# Patient Record
Sex: Female | Born: 1985 | Hispanic: Yes | Marital: Married | Smoking: Former smoker
Health system: Southern US, Community
[De-identification: ages and names within clinical notes are randomized; demographics above are authoritative.]

## PROBLEM LIST (undated history)

## (undated) DIAGNOSIS — G43909 Migraine, unspecified, not intractable, without status migrainosus: Secondary | ICD-10-CM

## (undated) DIAGNOSIS — R519 Headache, unspecified: Secondary | ICD-10-CM

## (undated) DIAGNOSIS — E039 Hypothyroidism, unspecified: Secondary | ICD-10-CM

## (undated) DIAGNOSIS — Z87440 Personal history of urinary (tract) infections: Secondary | ICD-10-CM

## (undated) DIAGNOSIS — E785 Hyperlipidemia, unspecified: Secondary | ICD-10-CM

## (undated) DIAGNOSIS — R51 Headache: Secondary | ICD-10-CM

## (undated) DIAGNOSIS — I10 Essential (primary) hypertension: Secondary | ICD-10-CM

## (undated) HISTORY — DX: Hyperlipidemia, unspecified: E78.5

## (undated) HISTORY — PX: HAND SURGERY: SHX662

## (undated) HISTORY — DX: Hypothyroidism, unspecified: E03.9

## (undated) HISTORY — DX: Essential (primary) hypertension: I10

## (undated) HISTORY — DX: Headache, unspecified: R51.9

## (undated) HISTORY — DX: Personal history of urinary (tract) infections: Z87.440

## (undated) HISTORY — PX: WISDOM TOOTH EXTRACTION: SHX21

## (undated) HISTORY — DX: Headache: R51

## (undated) HISTORY — DX: Migraine, unspecified, not intractable, without status migrainosus: G43.909

---
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
## 2003-08-21 ENCOUNTER — Emergency Department (HOSPITAL_COMMUNITY): Admission: EM | Admit: 2003-08-21 | Discharge: 2003-08-22 | Payer: Self-pay | Admitting: Emergency Medicine

## 2004-09-27 ENCOUNTER — Emergency Department (HOSPITAL_COMMUNITY): Admission: EM | Admit: 2004-09-27 | Discharge: 2004-09-27 | Payer: Self-pay | Admitting: Emergency Medicine

## 2005-05-09 ENCOUNTER — Emergency Department (HOSPITAL_COMMUNITY): Admission: EM | Admit: 2005-05-09 | Discharge: 2005-05-09 | Payer: Self-pay | Admitting: Emergency Medicine

## 2005-05-11 ENCOUNTER — Ambulatory Visit (HOSPITAL_COMMUNITY): Admission: RE | Admit: 2005-05-11 | Discharge: 2005-05-11 | Payer: Self-pay | Admitting: Obstetrics and Gynecology

## 2005-06-21 ENCOUNTER — Ambulatory Visit (HOSPITAL_COMMUNITY): Admission: AD | Admit: 2005-06-21 | Discharge: 2005-06-21 | Payer: Self-pay | Admitting: Obstetrics and Gynecology

## 2005-08-05 ENCOUNTER — Ambulatory Visit (HOSPITAL_COMMUNITY): Admission: AD | Admit: 2005-08-05 | Discharge: 2005-08-05 | Payer: Self-pay | Admitting: Obstetrics and Gynecology

## 2005-08-26 ENCOUNTER — Ambulatory Visit (HOSPITAL_COMMUNITY): Admission: AD | Admit: 2005-08-26 | Discharge: 2005-08-26 | Payer: Self-pay | Admitting: Obstetrics and Gynecology

## 2005-09-04 ENCOUNTER — Inpatient Hospital Stay (HOSPITAL_COMMUNITY): Admission: AD | Admit: 2005-09-04 | Discharge: 2005-09-06 | Payer: Self-pay | Admitting: Obstetrics & Gynecology

## 2005-09-16 ENCOUNTER — Ambulatory Visit (HOSPITAL_COMMUNITY): Admission: RE | Admit: 2005-09-16 | Discharge: 2005-09-16 | Payer: Self-pay | Admitting: Obstetrics & Gynecology

## 2005-09-26 ENCOUNTER — Emergency Department (HOSPITAL_COMMUNITY): Admission: EM | Admit: 2005-09-26 | Discharge: 2005-09-26 | Payer: Self-pay | Admitting: Emergency Medicine

## 2006-02-10 ENCOUNTER — Emergency Department (HOSPITAL_COMMUNITY): Admission: EM | Admit: 2006-02-10 | Discharge: 2006-02-10 | Payer: Self-pay | Admitting: Emergency Medicine

## 2006-04-23 ENCOUNTER — Ambulatory Visit (HOSPITAL_COMMUNITY): Admission: AD | Admit: 2006-04-23 | Discharge: 2006-04-23 | Payer: Self-pay | Admitting: Obstetrics and Gynecology

## 2006-08-22 ENCOUNTER — Ambulatory Visit (HOSPITAL_COMMUNITY): Admission: RE | Admit: 2006-08-22 | Discharge: 2006-08-22 | Payer: Self-pay | Admitting: Obstetrics and Gynecology

## 2006-08-26 ENCOUNTER — Ambulatory Visit (HOSPITAL_COMMUNITY): Admission: AD | Admit: 2006-08-26 | Discharge: 2006-08-26 | Payer: Self-pay | Admitting: Obstetrics and Gynecology

## 2006-09-01 ENCOUNTER — Inpatient Hospital Stay (HOSPITAL_COMMUNITY): Admission: AD | Admit: 2006-09-01 | Discharge: 2006-09-04 | Payer: Self-pay | Admitting: Obstetrics and Gynecology

## 2007-07-04 ENCOUNTER — Other Ambulatory Visit: Admission: RE | Admit: 2007-07-04 | Discharge: 2007-07-04 | Payer: Self-pay | Admitting: Obstetrics and Gynecology

## 2009-02-24 ENCOUNTER — Emergency Department (HOSPITAL_COMMUNITY): Admission: EM | Admit: 2009-02-24 | Discharge: 2009-02-24 | Payer: Self-pay | Admitting: Emergency Medicine

## 2009-06-29 ENCOUNTER — Ambulatory Visit (HOSPITAL_COMMUNITY): Admission: RE | Admit: 2009-06-29 | Discharge: 2009-06-29 | Payer: Self-pay | Admitting: Family Medicine

## 2009-08-17 ENCOUNTER — Emergency Department (HOSPITAL_COMMUNITY): Admission: EM | Admit: 2009-08-17 | Discharge: 2009-08-17 | Payer: Self-pay | Admitting: Emergency Medicine

## 2009-10-15 ENCOUNTER — Ambulatory Visit (HOSPITAL_COMMUNITY): Admission: RE | Admit: 2009-10-15 | Discharge: 2009-10-15 | Payer: Self-pay | Admitting: Family Medicine

## 2009-12-25 ENCOUNTER — Emergency Department (HOSPITAL_COMMUNITY): Admission: EM | Admit: 2009-12-25 | Discharge: 2009-12-25 | Payer: Self-pay | Admitting: Emergency Medicine

## 2010-08-10 NOTE — H&P (Signed)
NAME:  Sandra Boone, Sandra Boone              ACCOUNT NO.:  [id number]   MEDICAL RECORD NO.:  [id number]          PATIENT TYPE:  INP   LOCATION:  LDR                           FACILITY:  [facility]   PHYSICIAN:  Tilda Burrow, M.D. DATE OF BIRTH:  June 26, 1985   DATE OF ADMISSION:  DATE OF DISCHARGE:  LH                              HISTORY & PHYSICAL   DATE OF PLANNED ADMISSION:  09/01/2006.   CHIEF COMPLAINT:  Pregnancy at 39 weeks, elective induction.  History of  large for gestational age infant.   HISTORY OF PRESENT ILLNESS:  This 25 year old female, gravida 3, para 2,  AB 0, now at [redacted] weeks gestation, is admitted after being followed  through our office through 16 prenatal visits.  The patient is miserable  and requests induction.  She has been induced at 38 weeks in the past  with 6-1/2 hour labors resulting from it.  She has had babies that weigh  8 and 9 pounds to date.  This one's estimated fetal weight is the same  range.  Ultrasound was recently performed and is not available at this  moment, but done at [facility].  She has been in to Labor and  Delivery twice in the last week, complaining of pressure and labor  symptoms, but has not changed her cervix.  Her cervix is currently 2 cm,  60%, minus 3 with presenting part applied to the cervix but remaining  quite high.  This is almost identical to her last pregnancy, prodromal  symptoms.   Prenatal course has been notably for labs type A positive, UDS negative,  rubella immune.  Present hemoglobin 13, hematocrit 39 dropping to 12 and  37.2 at 28 weeks.  Hepatitis, HIV, RPR, GC, chlamydia are all negative.  Group B Strep is positive.  Glucose tolerance test 103 mg percent at 28  weeks.  She has had no glucosuria.   She has a history of abnormal Pap smears in the past, none currently of  concern.  She has a history of migraines, not currently being treated.   PAST SURGICAL HISTORY:  Right hand surgery.   ALLERGIES:   ASPIRIN.   SOCIAL HISTORY:  She is married, a homemaker.  She plans to have  epidural.  This is a female child and desires circumcision and plans for  oral contraception use post partum.   PHYSICAL EXAMINATION:  Height 5'4, weight 282 which is a 27 pound  weight gain.  Blood pressure 124/66.  Urinalysis negative at last check.  Fundal height 41 cm.  Cervix 2, 50%, minus 3, vertex and high.   PLAN:  Follow the above.  Cervical ripening Friday night with pitocin  induction and probable epidural on Saturday, 09/02/2006.      Tilda Burrow, M.D.     JVF/MEDQ  D:  08/29/2006  T:  08/29/2006  Job:  [id number]   cc:   Jeoffrey Massed, MD  Fax: [phone]

## 2010-08-10 NOTE — Op Note (Signed)
NAME:  Sandra Boone, Sandra Boone              ACCOUNT NO.:  [id number]   MEDICAL RECORD NO.:  [id number]          PATIENT TYPE:  INP   LOCATION:  LDR4                          FACILITY:  [facility]   PHYSICIAN:  Tilda Burrow, M.D. DATE OF BIRTH:  1985/11/13   DATE OF PROCEDURE:  09/02/2006  DATE OF DISCHARGE:                               OPERATIVE REPORT   LABOR SUMMARY AND DELIVERY NOTE   Sandra Boone was admitted with Foley bulb placed on September 01, 2006.  She was  begun on Pitocin at 4 a.m. and had amniotomy performed approximately  9:30 a.m.  Cervix was 4 cm at that time.  She progressed in labor nicely  with a reactive pattern notable for variable decelerations with  contractions and subsequently those resolved and early decelerations  noted associated with head compression, but no loss of beat to beat  variability or ominous signs of fetal compromise.  She progressed to  completely dilated shortly after 10:30 p.m. and pushed only twice  delivering over an intact perineum an 8 pound 15 ounce female infant,  Apgars 9 and 9, delivered with nuchal cord x1 slipped past the body as  the baby delivered.  The cord blood samples were obtained, placenta  delivered intact, Tomasa Blase presentation.  Estimated blood loss was 450  mL.  Uterus firm at -2 subsequent to delivery.      Tilda Burrow, M.D.  Electronically Signed     JVF/MEDQ  D:  09/02/2006  T:  09/02/2006  Job:  [id number]   cc:   Jeoffrey Massed, MD  Fax: [phone]

## 2010-08-10 NOTE — H&P (Signed)
NAME:  Sandra Boone, Sandra Boone              ACCOUNT NO.:  [id number]   MEDICAL RECORD NO.:  [id number]          PATIENT TYPE:  INP   LOCATION:  A411                          FACILITY:  [facility]   PHYSICIAN:  Tilda Burrow, M.D. DATE OF BIRTH:  08/01/1985   DATE OF ADMISSION:  DATE OF DISCHARGE:  LH                              HISTORY & PHYSICAL   DATE OF PLANNED ADMISSION:  09/01/2006.   CHIEF COMPLAINT:  Pregnancy at 39 weeks, elective induction.  History of  large for gestational age infant.   HISTORY OF PRESENT ILLNESS:  This 25 year old female, gravida 3, para 2,  AB 0, now at [redacted] weeks gestation, is admitted after being followed  through our office through 16 prenatal visits.  The patient is miserable  and requests induction.  She has been induced at 38 weeks in the past  with 6-1/2 hour labors resulting from it.  She has had babies that weigh  8 and 9 pounds to date.  This one's estimated fetal weight is the same  range.  Ultrasound was recently performed and is not available at this  moment, but done at [facility].  She has been in to Labor and  Delivery twice in the last week, complaining of pressure and labor  symptoms, but has not changed her cervix.  Her cervix is currently 2 cm,  60%, minus 3 with presenting part applied to the cervix but remaining  quite high.  This is almost identical to her last pregnancy, prodromal  symptoms.   Prenatal course has been notably for labs type A positive, UDS negative,  rubella immune.  Present hemoglobin 13, hematocrit 39 dropping to 12 and  37.2 at 28 weeks.  Hepatitis, HIV, RPR, GC, chlamydia are all negative.  Group B Strep is positive.  Glucose tolerance test 103 mg percent at 28  weeks.  She has had no glucosuria.   She has a history of abnormal Pap smears in the past, none currently of  concern.  She has a history of migraines, not currently being treated.   PAST SURGICAL HISTORY:  Right hand surgery.   ALLERGIES:   ASPIRIN.   SOCIAL HISTORY:  She is married, a homemaker.  She plans to have  epidural.  This is a female child and desires circumcision and plans for  oral contraception use post partum.   PHYSICAL EXAMINATION:  Height 5'4, weight 282 which is a 27 pound  weight gain.  Blood pressure 124/66.  Urinalysis negative at last check.  Fundal height 41 cm.  Cervix 2, 50%, minus 3, vertex and high.   PLAN:  Follow the above.  Cervical ripening Friday night with pitocin  induction and probable epidural on Saturday, 09/02/2006.      Tilda Burrow, M.D.  Electronically Signed     JVF/MEDQ  D:  08/29/2006  T:  08/29/2006  Job:  [id number]   cc:   Jeoffrey Massed, MD  Fax: [phone]

## 2010-08-13 NOTE — H&P (Signed)
NAME:  Sandra Boone, Sandra Boone                  ACCOUNT NO.:  0   MEDICAL RECORD NO.:  [id number]           PATIENT TYPE:   LOCATION:                                 FACILITY:   PHYSICIAN:  Tilda Burrow, M.D. DATE OF BIRTH:  1985/05/14   DATE OF ADMISSION:  09/04/2005  DATE OF DISCHARGE:  LH                                HISTORY & PHYSICAL   ADMISSION DIAGNOSES:  1.  Pregnancy at 38-1/2 weeks' gestation.  2.  Medical induction of labor due to continued prodromal labor symptoms and      maternal discomfort.   HISTORY OF PRESENT ILLNESS:  This 25 year old, G2, P32, LMP uncertain with  ultrasound assigned EDC of September 15, 2004, based on a 5-week 6-day ultrasound  with additional ultrasounds indicating September 09, 2004, September 10, 2004, and  September 18, 2004, is admitted for induction of labor.  [name] is huge with  abdominal girth of 44 cm.  She has had an ultrasound on August 30, 2005, to  assess estimated fetal weight which she has estimated fetal weight of 3665  g.  Amniotic fluid index is 14.87.  Cervix is soft and remains closed as of  August 29, 2005.  We are inducing her because of the persistent discomforts,  combined with concern over progressive fetal growth.  She has had normal  glucose tolerance test this pregnancy.  She has had serial ultrasounds  showing steady growth.  She has had size greater than dates throughout the  entire third-trimester.  She has normal amniotic fluid index of 17.  She has  a prior history of an 8 pound 5 ounces infant delivered at 39 weeks.  We are  inducing at this time.   PAST MEDICAL HISTORY:  1.  Positive for HSV.  2.  History of abnormal Pap.   PAST SURGICAL HISTORY:  Right hand surgery in past.   ALLERGIES:  ASPIRIN.   HABITS:  Cigarettes, alcohol, recreational drugs denied.   PHYSICAL EXAMINATION:  GENERAL:  Exam shows a healthy, large-framed,  Caucasian female.  HEENT:  Pupils equal, round and reactive.  Extraocular intact.  NECK:  Supple.  Trachea  midline.  CHEST:  Clear to auscultation.  ABDOMEN:  She has a 44 cm fundal height.  Estimated fetal weight 3665 g.   PRENATAL LABORATORY DATA:  Prenatal labs include blood type A positive.  Urine drug screen negative, rubella immune.  Present hemoglobin 12,  hematocrit 38.  Hepatitis, HIV, RPR, GC and chlamydial all normal.  Sickledex negative.  It is a female.  She plans to ________ feed.  Contraception is undetermined.   PLAN:  Foley bulb cervical ripening on Sunday, September 04, 2005, and Pitocin  induction on September 05, 2005.      Tilda Burrow, M.D.  Electronically Signed     JVF/MEDQ  D:  08/30/2005  T:  08/30/2005  Job:  [id number]
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
## 2010-08-13 NOTE — Op Note (Signed)
NAME:  Sandra Boone, Sandra Boone              ACCOUNT NO.:  [id number]   MEDICAL RECORD NO.:  [id number]          PATIENT TYPE:  INP   LOCATION:  A413                          FACILITY:  [facility]   PHYSICIAN:  Tilda Burrow, M.D. DATE OF BIRTH:  January 09, 1986   DATE OF PROCEDURE:  09/05/2005  DATE OF DISCHARGE:                                 OPERATIVE REPORT   Shikara Bradly Bienenstock progressed nicely to delivery at 3:01 p.m.; delivered over an  intact perineum and delivering a 9 pound infant.  Apgars were 9 and 9.  The  baby delivered easily over an intact perineum, with only first-degree skin  lacerations not requiring suture repair.  The placenta delivered easily with  Tomasa Blase presentation.  A 3-vessel cord, after blood samples obtained.  The  patient tolerated the procedure well, went to recovery with minimal blood  loss at delivery (an extremely dry delivery).  The patient remained somewhat  sedated from Nubain and Phenergan that was received 30 min prior to  delivery.  The baby was vigorous.-with stable maternal status.      Tilda Burrow, M.D.  Electronically Signed     JVF/MEDQ  D:  09/05/2005  T:  09/05/2005  Job:  [id number]

## 2010-08-13 NOTE — Group Therapy Note (Signed)
NAME:  Sandra, Boone              ACCOUNT NO.:  [id number]   MEDICAL RECORD NO.:  [id number]          PATIENT TYPE:  OIB   LOCATION:  LDR2                          FACILITY:  [facility]   PHYSICIAN:  Tilda Burrow, M.D. DATE OF BIRTH:  April 16, 1985   DATE OF PROCEDURE:  08/05/2005  DATE OF DISCHARGE:  08/05/2005                                   PROGRESS NOTE   Sandra Boone is 34 weeks 1 day gestation, came in with complaints of some vaginal  discharge that was mucusy in nature with a streak of pink.  She was  monitored and she had a reactive non-stress test and no contractions were  noted.  Her cervix was examined and the outer os is opened a little bit, but  the inner os is essentially closed, long, and soft.   IMPRESSION:  Intrauterine pregnancy at 34 weeks 1 day, loss of mucus plug  without labor or cervical change, reactive nonstress test.      Jacklyn Shell, C.N.M.      Tilda Burrow, M.D.  Electronically Signed    FC/MEDQ  D:  08/05/2005  T:  08/06/2005  Job:  [id number]

## 2010-08-13 NOTE — Op Note (Signed)
[facility]  Patient:    Sandra Boone, Sandra Boone Visit Number: [id number] MRN: [id number]          Service Type: OBS Location: LDR LDR2 01 Attending Physician:  Jeri Cos. Dictated by:   Langley Gauss, M.D. Proc. Date: 06/26/01 Admit Date:  06/20/2001 Discharge Date: 06/20/2001                             Operative Report  PROCEDURE:  Placement of Foley bulb intracervically for mechanical ripening of the cervix prior to induction of labor.  DESCRIPTION OF PROCEDURE:  The patient is on external fetal monitor.  Reactive nonstress test is noted.  The patient is placed in a dorsal lithotomy position.  She is prepped and draped in the usual sterile manner.  Sterile speculum was performed and the cervix is visualized, noted to be slightly dilated.  _____ solution is then used to sterilely prep the cervix.  A 16 French Foley catheter is then passed sterilely through the patent endocervical os into the lower uterine segment and inflated to a volume of 50 cc of sterile normal saline.  This is accomplished without difficulty.  No bleeding or rupture of membranes occurs.  The speculum is removed.  Following the procedure, nonstress test is currently in progress.  The patient tolerated the procedure very well. Dictated by:   Langley Gauss, M.D. Attending Physician:  Jeri Cos. DD:  06/27/01 TD:  06/29/01 Job: [id number]

## 2010-08-13 NOTE — Op Note (Signed)
[facility]  Patient:    Sandra Boone, Sandra Boone Visit Number: [id number] MRN: [id number]          Service Type: OBS Location: LDR LDR2 01 Attending Physician:  Jeri Cos. Dictated by:   Langley Gauss, M.D. Proc. Date: 06/26/01 Admit Date:  06/20/2001 Discharge Date: 06/20/2001                             Operative Report  PROCEDURE:  Placement of continuous lumbar epidural analgesia at the L3-4 interspace, performed by Langley Gauss, M.D.  COMPLICATIONS:  None.  DESCRIPTION OF PROCEDURE:  Continuous electronic fetal monitoring was performed.  The patient did have a reassuring fetal heart rate.  She was then placed in the seated position, at which time bony landmarks were identified. The L3-4 interspace was chosen.  The patients back is sterilely prepped and draped utilizing the epidural kit.  Five cubic centimeters of 1% lidocaine is then injected at the midline of the L3-4 interspace to raise a small skin wheal.  A 17-gauge Tuohy-Schliff needle is then used with loss of resistance in an air-filled glass syringe to atraumatically identify entry into the epidural space on the first attempt without difficulty.  Initial test dose of 5 cc 1.5% lidocaine plus epinephrine injected through the epidural needle.  No signs of CSF or intravascular injection obtained.  Thus the catheter was inserted to a depth of 5 cm.  The needle was removed.  Aspiration test on the catheter is negative.  A second test dose of 2 cc 1.5% lidocaine plus epinephrine injected through the epidural catheter.  Again no signs of CSF or intravascular injection obtained.  The patient is having tingling in the legs consistent with properly setting of epidural block.  The patient continues to have a reassuring fetal heart rate.  She is returned to the left lateral supine position.  With an excellent block setting up, the patient is examined and noted to be 4 cm dilated, 80% effaced,  with the vertex at a 0 station. Dictated by:   Langley Gauss, M.D. Attending Physician:  Jeri Cos. DD:  06/27/01 TD:  06/29/01 Job: [id number]

## 2010-08-13 NOTE — Consult Note (Signed)
NAME:  SOPHIRA, RUMLER              ACCOUNT NO.:  [id number]   MEDICAL RECORD NO.:  [id number]          PATIENT TYPE:  OIB   LOCATION:  A415                          FACILITY:  [facility]   PHYSICIAN:  Lazaro Arms, M.D.   DATE OF BIRTH:  10/28/1985   DATE OF CONSULTATION:  04/23/2006  DATE OF DISCHARGE:  04/23/2006                                 CONSULTATION   HISTORY OF PRESENT ILLNESS:  Demia is a 25 year old gravida 3, para 2,  at 21 weeks' gestation, who presents to labor and delivery complaining  of left lower quadrant abdominal pain.  She denies any bleeding or any  gushes of fluid.  Earlier she states she had pain that was radiating  down her left leg, but that has now stopped, now sort of just goes into  her lower back.  We did a UA which reveals positive nitrites, white  cells, red cells.  It might be slightly contaminated but it definitely  looks like she has got a bladder infection.  She has no CVA tenderness.  She does have some tenderness over the left __________.  The right side  is negative.  She does not appear to have a kidney stone.  She does not  have a history of a kidney stone and she is not moving in that way.  She  is not having either activity and the fetal heart rate has picked up  with the Doppler.  White count is 13,000 with an unremarkable  differential.  As a result she was given Rocephin 1 g IM and two Lortabs  here in labor and delivery.  She is discharged home to continue Bactrim  for the next week.  She will follow up in the office on Friday and she  is also given a prescription for 24 Lortab to take for discomfort.  If  she does not improve over the next 24-48 hours she will get instructions  to come back to the office.      Lazaro Arms, M.D.  Electronically Signed     LHE/MEDQ  D:  04/23/2006  T:  04/24/2006  Job:  [id number]

## 2010-08-13 NOTE — Op Note (Signed)
[facility]  Patient:    Sandra Boone, Sandra Boone Visit Number: [id number] MRN: [id number]          Service Type: OBS Location: LDR LDR2 01 Attending Physician:  Jeri Cos. Dictated by:   Langley Gauss, M.D. Proc. Date: 06/27/01 Admit Date:  06/20/2001 Discharge Date: 06/20/2001                             Operative Report  DELIVERY NOTE:  DIAGNOSES: 1. A 38+ week intrauterine pregnancy. 2. Gestational hypertension.  SERVICES PERFORMED:  Spontaneous assisted vaginal delivery of an 8 pound 1 ounce female infant over a midline episiotomy with repair.  Pain management for delivery includes continuous lumbar epidural placed by Langley Gauss, M.D., supplemented with 30 cc of 1% lidocaine in the midline of the perineal body.  SUMMARY:  The patient had an excellent epidural block in place with a bilateral block.  After the patient was examined and noted to be 4 cm dilated, there was impression that she would continue to labor and make change.  The patient thereafter progressed normally along the labor curve to complete dilatation, after which time she began having a strong urge to push.  She was placed in the dorsal lithotomy position and prepped and draped in the usual sterile manner.  Foley catheter was removed.  The patient pushed well during this second stage of liver with delivery in a direct OA position over the midline episiotomy without extension.  A small midline episiotomy had been performed just prior to delivery of the head.  Of note, at time of delivery is nuchal cord x1 and without compression.  This is reduced prior to delivery of the shoulders.  Also noted is some thin green meconium-stained amniotic fluid. Thus the infants mouth and nares were aggressively bulb suctioned on the perineum.  Of note, we had not seen any meconium previously so a DeLee suction catheter is not available.  After suctioning the infant, the umbilical cord  is milked toward the infant, the cord is doubly clamped and cut, and the infant was taken to the nursing table, at which time a DeLee suction was now available and connected to wall suction.  I aggressively suctioned the infants nasal passages as well as mouth and back of throat, obtaining about 4 cc of thin, green-stained meconium amniotic fluid.  The infant was noted to have a spontaneous and vigorous breathe and cry.  I then returned to the mother, at which time arterial cord gas and the cord blood were obtained. Gentle traction on the umbilical cord results in separation, which on my examination appears to be intact at the _____.  Examination of the genital tract reveals no lacerations.  The midline episiotomy is not extended.  This is easily repaired utilizing 0 chromic in a running locked fashion on the vaginal mucosa, followed by two-layer closure of 0 chromic on the perineal body.  The patient tolerated the delivery very well.  She was taken out of dorsal lithotomy position and rolled to her side, at which time the epidural catheter is removed with the blue tip noted to be intact and shown to the patient.  Both mother and infant are doing very well following delivery. Dictated by:   Langley Gauss, M.D. Attending Physician:  Jeri Cos. DD:  06/27/01 TD:  06/29/01 Job: [id number]

## 2010-08-13 NOTE — Discharge Summary (Signed)
[facility]  Patient:    Sandra Boone, Sandra Boone Visit Number: [id number] MRN: [id number]          Service Type: OBS Location: LDR LDR2 01 Attending Physician:  Jeri Cos. Dictated by:   Langley Gauss, M.D. Admit Date:  06/20/2001 Discharge Date: 06/20/2001   CC:         [facility]   Discharge Summary  DIAGNOSES: 1. Thirty-eight-plus weeks intrauterine pregnancy. 2. Gestational hypertension, pertinently no history of hypertension preceding    the pregnancy.  PROCEDURES:  Performed for induction of labor: 1. Placement of Foley bulb anterocervically for mechanical ripening of the    cervix on June 26, 2001.  This was removed in the p.m. of June 26, 2001. 2. On June 27, 2001, placement of continuous lumbar epidural analgesia. 3. June 27, 2001, spontaneous assisted vaginal delivery of 8-pound, 1-ounce    female infant. 4. Midline episiotomy and repair.  Findings at the time of delivery include a nuchal cord x1 without compression as well as new finding of meconium-stained amniotic fluid.  LABORATORY DATA:  Hemoglobin and hematocrit 11.7/33.1, white count 10.2.  On postpartum day #1 10.9/30.1.  DISCHARGE INSTRUCTIONS:  The patient is breast-feeding at the time of discharge.  She is utilizing [place] for newborn pediatrics.  FOLLOW-UP:  The patient is to follow up in the office in four weeks time. She is given a copy of standard discharge instructions.  DISCHARGE MEDICATIONS:  Tylox 1-2 every four hours p.r.n. for severe back pain.  HOSPITAL COURSE:  See previous dictations.  The patient delivered June 27, 2001, without difficulty.  Epidural start time was June 27, 2001, at 0005. Epidural discontinued at 0545 on June 27, 2001.  The patient did well postpartum.  She had no postpartum complications; specifically, no postpartum hemorrhage.  Vital signs remained stable.  She remained afebrile.  She did receive a single dose of 1 g IV Rocephin  during the course of labor when she became febrile.  The patient did well postpartum.  She did initially receive Tylenol No. 3 for back pain, but this was inadequate.  With a change to Tylox she did markedly better, was able to ambulate without difficulty. Dictated by:   Langley Gauss, M.D. Attending Physician:  Jeri Cos. DD:  06/28/01 TD:  06/28/01 Job: [id number]

## 2010-08-13 NOTE — Consult Note (Signed)
NAME:  [name], [name]              ACCOUNT NO.:  [id number]   MEDICAL RECORD NO.:  [id number]          PATIENT TYPE:  OIB   LOCATION:  A415                          FACILITY:  [facility]   PHYSICIAN:  Tilda Burrow, M.D. DATE OF BIRTH:  1985-10-05   DATE OF CONSULTATION:  DATE OF DISCHARGE:  08/26/2005                                   CONSULTATION   OBSERVATION NOTE:   This is a 25 year old female sent from the office to evaluate status. She  presents with back pain and pressure in the lower abdomen and back, headache  and blurred vision with blood pressures that were elevated in the office,  presenting to labor and delivery for monitoring to see if they stabilize.  Fetal heart rate tracing is reportedly normal. She has received two Tylox  for pain management and vital signs are stable, cervix is closed, 25%  effaced, -2 by nursing evaluation.  As stated earlier she was seen in the  office by [facility] staff earlier.  After observation to 8 p.m.,  three hours, she was considered stable for discharge and was sent home to  follow up in one week as scheduled for routine postpartum visit. Blood  pressures were normal and fetal well being confirmed.      Tilda Burrow, M.D.  Electronically Signed     JVF/MEDQ  D:  09/21/2005  T:  09/21/2005  Job:  [id number]

## 2010-12-01 ENCOUNTER — Other Ambulatory Visit (HOSPITAL_COMMUNITY)
Admission: RE | Admit: 2010-12-01 | Discharge: 2010-12-01 | Disposition: A | Discharge status: Home / Self Care | Payer: Medicaid Other | Source: Ambulatory Visit | Attending: Obstetrics and Gynecology | Admitting: Obstetrics and Gynecology

## 2010-12-01 ENCOUNTER — Other Ambulatory Visit: Payer: Self-pay | Admitting: Obstetrics & Gynecology

## 2010-12-01 ENCOUNTER — Other Ambulatory Visit: Payer: Self-pay | Admitting: Adult Health

## 2010-12-01 DIAGNOSIS — Z01419 Encounter for gynecological examination (general) (routine) without abnormal findings: Secondary | ICD-10-CM | POA: Insufficient documentation

## 2010-12-01 DIAGNOSIS — N63 Unspecified lump in unspecified breast: Secondary | ICD-10-CM

## 2010-12-01 DIAGNOSIS — Z113 Encounter for screening for infections with a predominantly sexual mode of transmission: Secondary | ICD-10-CM | POA: Insufficient documentation

## 2010-12-15 ENCOUNTER — Ambulatory Visit (HOSPITAL_COMMUNITY)
Admission: RE | Admit: 2010-12-15 | Discharge: 2010-12-15 | Disposition: A | Discharge status: Home / Self Care | Payer: Medicaid Other | Source: Ambulatory Visit | Attending: Obstetrics & Gynecology | Admitting: Obstetrics & Gynecology

## 2010-12-15 DIAGNOSIS — N63 Unspecified lump in unspecified breast: Secondary | ICD-10-CM | POA: Insufficient documentation

## 2011-01-13 LAB — DIFFERENTIAL
Basophils Relative: 0
Eosinophils Relative: 1
Lymphocytes Relative: 16
Monocytes Absolute: 0.8 — ABNORMAL HIGH
Monocytes Relative: 7
Neutro Abs: 9 — ABNORMAL HIGH

## 2011-01-13 LAB — HEMOGLOBIN AND HEMATOCRIT, BLOOD
HCT: 31.6 — ABNORMAL LOW
Hemoglobin: 11 — ABNORMAL LOW

## 2011-01-13 LAB — ABO/RH: ABO/RH(D): A POS

## 2011-01-13 LAB — CORD BLOOD GAS (ARTERIAL)
TCO2: 27.4
pH cord blood (arterial): 7.275

## 2011-01-13 LAB — CBC: WBC: 11.8 — ABNORMAL HIGH

## 2014-10-09 ENCOUNTER — Encounter: Payer: Self-pay | Admitting: Advanced Practice Midwife

## 2014-10-09 ENCOUNTER — Ambulatory Visit (INDEPENDENT_AMBULATORY_CARE_PROVIDER_SITE_OTHER): Payer: Self-pay | Admitting: Advanced Practice Midwife

## 2014-10-09 VITALS — BP 124/72 | HR 100 | Ht 65.0 in | Wt 319.6 lb

## 2014-10-09 DIAGNOSIS — Z3046 Encounter for surveillance of implantable subdermal contraceptive: Secondary | ICD-10-CM | POA: Insufficient documentation

## 2014-10-09 DIAGNOSIS — Z3049 Encounter for surveillance of other contraceptives: Secondary | ICD-10-CM

## 2014-10-09 NOTE — Progress Notes (Signed)
HPI:  Sandra Boone 29 y.o. here for Nexplanon removal. It has been in 4.5 years, has started irregular periods.  Her future plans for birth control are condoms. 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  Past Medical History: History reviewed. No pertinent past medical history.  Past Surgical History: Past Surgical History  Procedure Laterality Date  . Hand surgery Right     Family History: Family History  Problem Relation Age of Onset  . Heart failure Maternal Grandmother   . Hypertension Maternal Grandmother   . Diabetes Maternal Grandmother   . Heart failure Maternal Grandfather   . Hypertension Maternal Grandfather   . Diabetes Maternal Grandfather   . Heart failure Mother   . Hypothyroidism Mother   . Lupus Sister   . Hypothyroidism Daughter     Social History: History  Substance Use Topics  . Smoking status: Former Smoker    Types: Cigarettes  . Smokeless tobacco: Never Used  . Alcohol Use: Yes     Comment: occ    Allergies:  Allergies  Allergen Reactions  . Aspirin Hives    Meds:  (Not in a hospital admission)    Patient given informed consent for removal of her Nexplanon, time out was performed.  Signed copy in the chart.  Appropriate time out taken. Implanon site identified.  Area prepped in usual sterile fashon. One cc of 1% lidocaine was used to anesthetize the area at the distal end of the implant. A small stab incision was made right beside the implant on the distal portion.  The Nexplanon rod was grasped using hemostats and removed without difficulty.  There was less than 3 cc blood loss. There were no complications.  A small amount of antibiotic ointment and steri-strips were applied over the small incision.  A pressure bandage was applied to reduce any bruising.  The patient tolerated the procedure well and was given post procedure instructions.

## 2016-06-17 ENCOUNTER — Encounter: Payer: Self-pay | Admitting: Family

## 2016-06-17 ENCOUNTER — Ambulatory Visit (INDEPENDENT_AMBULATORY_CARE_PROVIDER_SITE_OTHER): Payer: BC Managed Care – PPO | Admitting: Family

## 2016-06-17 VITALS — BP 144/88 | HR 78 | Temp 98.6°F | Resp 16 | Ht 65.0 in | Wt 330.0 lb

## 2016-06-17 DIAGNOSIS — E6609 Other obesity due to excess calories: Secondary | ICD-10-CM | POA: Diagnosis not present

## 2016-06-17 DIAGNOSIS — I10 Essential (primary) hypertension: Secondary | ICD-10-CM

## 2016-06-17 DIAGNOSIS — Z6841 Body Mass Index (BMI) 40.0 and over, adult: Secondary | ICD-10-CM

## 2016-06-17 DIAGNOSIS — IMO0001 Reserved for inherently not codable concepts without codable children: Secondary | ICD-10-CM

## 2016-06-17 MED ORDER — HYDROCHLOROTHIAZIDE 12.5 MG PO TABS: 12.5000 mg | ORAL_TABLET | Freq: Every day | ORAL | 1 refills | Status: DC

## 2016-06-17 NOTE — Assessment & Plan Note (Signed)
Blood pressure above goal 140/90 with lifestyle management. Continues to work with [employer] for weight loss. Urged low-sodium diet. Given lower extremity edema, start hydrochlorothiazide. Continue to monitor blood pressures at home. Denies worst headache of life with no new symptoms of end organ damage noted on physical exam. Follow-up in one month or sooner if needed.

## 2016-06-17 NOTE — Assessment & Plan Note (Signed)
BMI 54. Recommend weight loss of 5-10% of current body weight through nutrition and physical activity. Currently working with [employer] and having successes she's lost approximately 20 pounds. Information on weight loss medications provided for patient to research. Recommended caloric intake of 1200-1500 cal per day. Consider calorie tracking software/application to monitor intake. Continue exercise with goal of 2-3 times per week of moderate level activity and 10,000 steps per day. Encouraged continued nutritional changes in lifestyle management. Continue to monitor.

## 2016-06-17 NOTE — Progress Notes (Signed)
Subjective:    Patient ID: Sandra Boone, female    DOB: [birth year], 31 y.o.   MRN: [id number]  Chief Complaint  Patient presents with  . Establish Care    blood pressure and swelling in feet hands and face    HPI:  Sandra Boone is a 31 y.o. female who  has a past medical history of Frequent headaches; History of frequent urinary tract infections; Hyperlipidemia; Hypertension; and Migraines. and presents today for an office visit to establish care.   1.) Hypertension - Previously diagnosed with hypertension. Not currently maintained on medication. Denies worst headache of life. Has noted increased in lower extremity swelling and sweating on occasion. Working on losing weight through [employer].   BP Readings from Last 3 Encounters:  06/17/16 (!) 144/88  10/09/14 124/72    Allergies  Allergen Reactions  . Aspirin Hives      No outpatient prescriptions prior to visit.   No facility-administered medications prior to visit.      Past Medical History:  Diagnosis Date  . Frequent headaches   . History of frequent urinary tract infections   . Hyperlipidemia   . Hypertension   . Migraines       Past Surgical History:  Procedure Laterality Date  . HAND SURGERY Right       Family History  Problem Relation Age of Onset  . Heart failure Maternal Grandmother   . Hypertension Maternal Grandmother   . Diabetes Maternal Grandmother   . Heart failure Maternal Grandfather   . Hyperlipidemia Maternal Grandfather   . Heart failure Mother   . Hypothyroidism Mother   . Hyperlipidemia Mother   . Hypertension Mother   . Lupus Sister   . Hypothyroidism Daughter       Social History   Social History  . Marital status: Married    Spouse name: N/A  . Number of children: 3  . Years of education: 30   Occupational History  . Teacher    Social History Main Topics  . Smoking status: Former Smoker    Types: Cigarettes  . Smokeless tobacco: Never Used    . Alcohol use Yes     Comment: Rarely   . Drug use: No  . Sexual activity: Yes    Birth control/ protection: None   Other Topics Concern  . Not on file   Social History Narrative   Fun/Hobby: Walk, sleep    Denies abuse and feels safe at home.      Review of Systems  Constitutional: Negative for chills and fever.  Eyes:       Negative for changes in vision  Respiratory: Negative for cough, chest tightness and wheezing.   Cardiovascular: Negative for chest pain, palpitations and leg swelling.  Neurological: Negative for dizziness, weakness and light-headedness.       Objective:    BP (!) 144/88 (BP Location: Left Arm, Patient Position: Sitting, Cuff Size: Large)   Pulse 78   Temp 98.6 F (37 C) (Oral)   Resp 16   Ht _0  (1.651 m)   Wt (!) 330 lb (149.7 kg)   SpO2 98%   BMI 54.91 kg/m  Nursing note and vital signs reviewed.  Physical Exam  Constitutional: She is oriented to person, place, and time. She appears well-developed and well-nourished. No distress.  Cardiovascular: Normal rate, regular rhythm, normal heart sounds and intact distal pulses.  Exam reveals no gallop and no friction rub.   No murmur heard.  Mild non-pitting edema of bilateral lower extremities.  Pulmonary/Chest: Effort normal and breath sounds normal. No respiratory distress. She has no wheezes. She has no rales. She exhibits no tenderness.  Neurological: She is alert and oriented to person, place, and time.  Skin: Skin is warm and dry.  Psychiatric: She has a normal mood and affect. Her behavior is normal. Judgment and thought content normal.        Assessment & Plan:   Problem List Items Addressed This Visit      Cardiovascular and Mediastinum   Essential hypertension - Primary    Blood pressure above goal 140/90 with lifestyle management. Continues to work with [employer] for weight loss. Urged low-sodium diet. Given lower extremity edema, start hydrochlorothiazide. Continue to  monitor blood pressures at home. Denies worst headache of life with no new symptoms of end organ damage noted on physical exam. Follow-up in one month or sooner if needed.      Relevant Medications   hydrochlorothiazide (HYDRODIURIL) 12.5 MG tablet   Other Relevant Orders   Comp Met (CMET)   Hemoglobin A1c   Lipid Profile     Other   Class 3 obesity due to excess calories with serious comorbidity and body mass index (BMI) of 50.0 to 59.9 in adult (HCC)    BMI 54. Recommend weight loss of 5-10% of current body weight through nutrition and physical activity. Currently working with [employer] and having successes she's lost approximately 20 pounds. Information on weight loss medications provided for patient to research. Recommended caloric intake of 1200-1500 cal per day. Consider calorie tracking software/application to monitor intake. Continue exercise with goal of 2-3 times per week of moderate level activity and 10,000 steps per day. Encouraged continued nutritional changes in lifestyle management. Continue to monitor.          I am having Ms. Dealmeida start on hydrochlorothiazide.   Meds ordered this encounter  Medications  . hydrochlorothiazide (HYDRODIURIL) 12.5 MG tablet    Sig: Take 1 tablet (12.5 mg total) by mouth daily.    Dispense:  30 tablet    Refill:  1    Order Specific Question:   Supervising Provider    Answer:   Pricilla Holm A [[id number]]     Follow-up: Return in about 1 month (around 07/18/2016), or if symptoms worsen or fail to improve.  Mauricio Po, FNP

## 2016-06-17 NOTE — Patient Instructions (Addendum)
Thank you for choosing [facility].  SUMMARY AND INSTRUCTIONS:  Please stat taking hydrochlorothiazide daily.  Continue to monitor blood pressure at home.   Continue to follow low-sodium diet.  Check on prices for the following medications: Belviq, Contrave, Qsymia, and Saxenda.  Continue to work with [employer].  Follow up in 1 month.  You are doing great!!!  Keep it up!!!   Medication:  Your prescription(s) have been submitted to your pharmacy or been printed and provided for you. Please take as directed and contact our office if you believe you are having problem(s) with the medication(s) or have any questions.  Labs:  Please stop by the lab on the lower level of the building for your blood work. Your results will be released to MyChart (or called to you) after review, usually within 72 hours after test completion. If any changes need to be made, you will be notified at that same time.  1.) The lab is open from 7:30am to 5:30 pm Monday-Friday 2.) No appointment is necessary 3.) Fasting (if needed) is 6-8 hours after food and drink; black coffee and water are okay   Follow up:  If your symptoms worsen or fail to improve, please contact our office for further instruction, or in case of emergency go directly to the emergency room at the closest medical facility.     DASH Eating Plan DASH stands for "Dietary Approaches to Stop Hypertension." The DASH eating plan is a healthy eating plan that has been shown to reduce high blood pressure (hypertension). It may also reduce your risk for type 2 diabetes, heart disease, and stroke. The DASH eating plan may also help with weight loss. What are tips for following this plan? General guidelines   Avoid eating more than 2,300 mg (milligrams) of salt (sodium) a day. If you have hypertension, you may need to reduce your sodium intake to 1,500 mg a day.  Limit alcohol intake to no more than 1 drink a day for nonpregnant  women and 2 drinks a day for men. One drink equals 12 oz of beer, 5 oz of wine, or 1 oz of hard liquor.  Work with your health care provider to maintain a healthy body weight or to lose weight. Ask what an ideal weight is for you.  Get at least 30 minutes of exercise that causes your heart to beat faster (aerobic exercise) most days of the week. Activities may include walking, swimming, or biking.  Work with your health care provider or diet and nutrition specialist (dietitian) to adjust your eating plan to your individual calorie needs. Reading food labels   Check food labels for the amount of sodium per serving. Choose foods with less than 5 percent of the Daily Value of sodium. Generally, foods with less than 300 mg of sodium per serving fit into this eating plan.  To find whole grains, look for the word "whole" as the first word in the ingredient list. Shopping   Buy products labeled as "low-sodium" or "no salt added."  Buy fresh foods. Avoid canned foods and premade or frozen meals. Cooking   Avoid adding salt when cooking. Use salt-free seasonings or herbs instead of table salt or sea salt. Check with your health care provider or pharmacist before using salt substitutes.  Do not fry foods. Cook foods using healthy methods such as baking, boiling, grilling, and broiling instead.  Cook with heart-healthy oils, such as olive, canola, soybean, or sunflower oil. Meal planning  Eat a balanced diet that includes:  5 or more servings of fruits and vegetables each day. At each meal, try to fill half of your plate with fruits and vegetables.  Up to 6-8 servings of whole grains each day.  Less than 6 oz of lean meat, poultry, or fish each day. A 3-oz serving of meat is about the same size as a deck of cards. One egg equals 1 oz.  2 servings of low-fat dairy each day.  A serving of nuts, seeds, or beans 5 times each week.  Heart-healthy fats. Healthy fats called Omega-3 fatty  acids are found in foods such as flaxseeds and coldwater fish, like sardines, salmon, and mackerel.  Limit how much you eat of the following:  Canned or prepackaged foods.  Food that is high in trans fat, such as fried foods.  Food that is high in saturated fat, such as fatty meat.  Sweets, desserts, sugary drinks, and other foods with added sugar.  Full-fat dairy products.  Do not salt foods before eating.  Try to eat at least 2 vegetarian meals each week.  Eat more home-cooked food and less restaurant, buffet, and fast food.  When eating at a restaurant, ask that your food be prepared with less salt or no salt, if possible. What foods are recommended? The items listed may not be a complete list. Talk with your dietitian about what dietary choices are best for you. Grains  Whole-grain or whole-wheat bread. Whole-grain or whole-wheat pasta. Brown rice. Orpah Cobb. Bulgur. Whole-grain and low-sodium cereals. Pita bread. Low-fat, low-sodium crackers. Whole-wheat flour tortillas. Vegetables  Fresh or frozen vegetables (raw, steamed, roasted, or grilled). Low-sodium or reduced-sodium tomato and vegetable juice. Low-sodium or reduced-sodium tomato sauce and tomato paste. Low-sodium or reduced-sodium canned vegetables. Fruits  All fresh, dried, or frozen fruit. Canned fruit in natural juice (without added sugar). Meat and other protein foods  Skinless chicken or [place]. Ground chicken or [place]. Pork with fat trimmed off. Fish and seafood. Egg whites. Dried beans, peas, or lentils. Unsalted nuts, nut butters, and seeds. Unsalted canned beans. Lean cuts of beef with fat trimmed off. Low-sodium, lean deli meat. Dairy  Low-fat (1%) or fat-free (skim) milk. Fat-free, low-fat, or reduced-fat cheeses. Nonfat, low-sodium ricotta or cottage cheese. Low-fat or nonfat yogurt. Low-fat, low-sodium cheese. Fats and oils  Soft margarine without trans fats. Vegetable oil. Low-fat, reduced-fat, or  light mayonnaise and salad dressings (reduced-sodium). Canola, safflower, olive, soybean, and sunflower oils. Avocado. Seasoning and other foods  Herbs. Spices. Seasoning mixes without salt. Unsalted popcorn and pretzels. Fat-free sweets. What foods are not recommended? The items listed may not be a complete list. Talk with your dietitian about what dietary choices are best for you. Grains  Baked goods made with fat, such as croissants, muffins, or some breads. Dry pasta or rice meal packs. Vegetables  Creamed or fried vegetables. Vegetables in a cheese sauce. Regular canned vegetables (not low-sodium or reduced-sodium). Regular canned tomato sauce and paste (not low-sodium or reduced-sodium). Regular tomato and vegetable juice (not low-sodium or reduced-sodium). Rosita Fire. Olives. Fruits  Canned fruit in a light or heavy syrup. Fried fruit. Fruit in cream or butter sauce. Meat and other protein foods  Fatty cuts of meat. Ribs. Fried meat. Tomasa Blase. Sausage. Bologna and other processed lunch meats. Salami. Fatback. Hotdogs. Bratwurst. Salted nuts and seeds. Canned beans with added salt. Canned or smoked fish. Whole eggs or egg yolks. Chicken or [place] with skin. Dairy  Whole or 2% milk,  cream, and half-and-half. Whole or full-fat cream cheese. Whole-fat or sweetened yogurt. Full-fat cheese. Nondairy creamers. Whipped toppings. Processed cheese and cheese spreads. Fats and oils  Butter. Stick margarine. Lard. Shortening. Ghee. Bacon fat. Tropical oils, such as coconut, palm kernel, or palm oil. Seasoning and other foods  Salted popcorn and pretzels. Onion salt, garlic salt, seasoned salt, table salt, and sea salt. Worcestershire sauce. Tartar sauce. Barbecue sauce. Teriyaki sauce. Soy sauce, including reduced-sodium. Steak sauce. Canned and packaged gravies. Fish sauce. Oyster sauce. Cocktail sauce. Horseradish that you find on the shelf. Ketchup. Mustard. Meat flavorings and tenderizers. Bouillon cubes.  Hot sauce and Tabasco sauce. Premade or packaged marinades. Premade or packaged taco seasonings. Relishes. Regular salad dressings. Where to find more information:  National Heart, Lung, and Blood Institute: PopSteam.is  American Heart Association: www.heart.org Summary  The DASH eating plan is a healthy eating plan that has been shown to reduce high blood pressure (hypertension). It may also reduce your risk for type 2 diabetes, heart disease, and stroke.  With the DASH eating plan, you should limit salt (sodium) intake to 2,300 mg a day. If you have hypertension, you may need to reduce your sodium intake to 1,500 mg a day.  When on the DASH eating plan, aim to eat more fresh fruits and vegetables, whole grains, lean proteins, low-fat dairy, and heart-healthy fats.  Work with your health care provider or diet and nutrition specialist (dietitian) to adjust your eating plan to your individual calorie needs. This information is not intended to replace advice given to you by your health care provider. Make sure you discuss any questions you have with your health care provider. Document Released: 03/03/2011 Document Revised: 03/07/2016 Document Reviewed: 03/07/2016 Elsevier Interactive Patient Education  2017 [employer].

## 2016-06-30 ENCOUNTER — Other Ambulatory Visit (INDEPENDENT_AMBULATORY_CARE_PROVIDER_SITE_OTHER): Payer: BC Managed Care – PPO

## 2016-06-30 DIAGNOSIS — I10 Essential (primary) hypertension: Secondary | ICD-10-CM | POA: Diagnosis not present

## 2016-06-30 LAB — LIPID PANEL
CHOL/HDL RATIO: 3
Cholesterol: 150 mg/dL (ref 0–200)
HDL: 43.4 mg/dL (ref >39.00)
LDL Cholesterol: 73 mg/dL (ref 0–99)
NONHDL: 106.48
Triglycerides: 169 mg/dL — ABNORMAL HIGH (ref 0.0–149.0)
VLDL: 33.8 mg/dL (ref 0.0–40.0)

## 2016-06-30 LAB — HEMOGLOBIN A1C: Hgb A1c MFr Bld: 5.4 % (ref 4.6–6.5)

## 2016-06-30 LAB — COMPREHENSIVE METABOLIC PANEL
ALBUMIN: 3.7 g/dL (ref 3.5–5.2)
ALK PHOS: 58 U/L (ref 39–117)
ALT: 15 U/L (ref 0–35)
AST: 13 U/L (ref 0–37)
BILIRUBIN TOTAL: 0.3 mg/dL (ref 0.2–1.2)
BUN: 13 mg/dL (ref 6–23)
CO2: 28 mEq/L (ref 19–32)
CREATININE: 0.72 mg/dL (ref 0.40–1.20)
Calcium: 9 mg/dL (ref 8.4–10.5)
Chloride: 106 mEq/L (ref 96–112)
GFR: 99.56 mL/min (ref >60.00)
GLUCOSE: 101 mg/dL — AB (ref 70–99)
POTASSIUM: 3.8 meq/L (ref 3.5–5.1)
SODIUM: 140 meq/L (ref 135–145)
TOTAL PROTEIN: 6.9 g/dL (ref 6.0–8.3)

## 2016-07-18 ENCOUNTER — Encounter: Payer: Self-pay | Admitting: Family

## 2016-07-18 ENCOUNTER — Ambulatory Visit (INDEPENDENT_AMBULATORY_CARE_PROVIDER_SITE_OTHER): Payer: BC Managed Care – PPO | Admitting: Family

## 2016-07-18 VITALS — BP 118/76 | HR 74 | Resp 16 | Ht 65.0 in | Wt 326.0 lb

## 2016-07-18 DIAGNOSIS — I10 Essential (primary) hypertension: Secondary | ICD-10-CM | POA: Diagnosis not present

## 2016-07-18 MED ORDER — HYDROCHLOROTHIAZIDE 12.5 MG PO TABS: 12.5000 mg | ORAL_TABLET | Freq: Every day | ORAL | 1 refills | Status: DC

## 2016-07-18 NOTE — Patient Instructions (Addendum)
Thank you for choosing [facility].  SUMMARY AND INSTRUCTIONS:  Please continue to take your medications as prescribed.   Follow a low sodium intake.   Schedule a time for your convenience.   Continue to monitor your blood pressure at home.   Medication:  Your prescription(s) have been submitted to your pharmacy or been printed and provided for you. Please take as directed and contact our office if you believe you are having problem(s) with the medication(s) or have any questions.   Follow up:  If your symptoms worsen or fail to improve, please contact our office for further instruction, or in case of emergency go directly to the emergency room at the closest medical facility.

## 2016-07-18 NOTE — Assessment & Plan Note (Signed)
Blood pressure with improved control and below goal 140/90 with current medication regimen and no adverse side effects. Denies worst headache of life with no symptoms of end organ damage noted on physical exam. Continue current dosage of hydrochlorothiazide. Recently lost 4 pounds since previous office visit. Continue current dosage of hydrochlorothiazide. Encouraged to monitor blood pressure at home and follow sodium diet.

## 2016-07-18 NOTE — Progress Notes (Signed)
Subjective:    Patient ID: Sandra Boone, female    DOB: January 25, 1986, 30 y.o.   MRN: [id number]  Chief Complaint  Patient presents with  . Follow-up    hypertension    HPI:  Sandra Boone is a 31 y.o. female who  has a past medical history of Frequent headaches; History of frequent urinary tract infections; Hyperlipidemia; Hypertension; and Migraines. and presents today for a follow up office visit.  Hypertension -  Currently maintained on Hydrochlorothiazide and reports taking the medications as prescribed and denies adverse side effects or hypotensive readings. Blood pressures at home have been very well controlled. Denies changes in vision, worst headache of life or new symptoms of end organ damage. Working on following a low sodium intake.    BP Readings from Last 3 Encounters:  07/18/16 118/76  06/17/16 (!) 144/88  10/09/14 124/72     Wt Readings from Last 3 Encounters:  07/18/16 (!) 326 lb (147.9 kg)  06/17/16 (!) 330 lb (149.7 kg)  10/09/14 (!) 319 lb 9.6 oz (145 kg)     Allergies  Allergen Reactions  . Aspirin Hives      Outpatient Medications Prior to Visit  Medication Sig Dispense Refill  . hydrochlorothiazide (HYDRODIURIL) 12.5 MG tablet Take 1 tablet (12.5 mg total) by mouth daily. 30 tablet 1   No facility-administered medications prior to visit.      Review of Systems  Constitutional: Negative for chills and fever.  Eyes:       Negative for changes in vision  Respiratory: Negative for cough, chest tightness and wheezing.   Cardiovascular: Negative for chest pain, palpitations and leg swelling.  Neurological: Negative for dizziness, weakness and light-headedness.      Objective:    BP 118/76 (BP Location: Left Arm, Patient Position: Sitting, Cuff Size: Large)   Pulse 74   Resp 16   Ht  (1.651 m)   Wt (!) 326 lb (147.9 kg)   SpO2 95%   BMI 54.25 kg/m  Nursing note and vital signs reviewed.  Physical Exam  Constitutional: She  is oriented to person, place, and time. She appears well-developed and well-nourished. No distress.  Cardiovascular: Normal rate, regular rhythm, normal heart sounds and intact distal pulses.   Trace/minimal bilateral lower extremity edema.  Pulmonary/Chest: Effort normal and breath sounds normal.  Neurological: She is alert and oriented to person, place, and time.  Skin: Skin is warm and dry.  Psychiatric: She has a normal mood and affect. Her behavior is normal. Judgment and thought content normal.       Assessment & Plan:   Problem List Items Addressed This Visit      Cardiovascular and Mediastinum   Essential hypertension - Primary    Blood pressure with improved control and below goal 140/90 with current medication regimen and no adverse side effects. Denies worst headache of life with no symptoms of end organ damage noted on physical exam. Continue current dosage of hydrochlorothiazide. Recently lost 4 pounds since previous office visit. Continue current dosage of hydrochlorothiazide. Encouraged to monitor blood pressure at home and follow sodium diet.      Relevant Medications   hydrochlorothiazide (HYDRODIURIL) 12.5 MG tablet      I am having Ms. Kawashima maintain her hydrochlorothiazide.   Meds ordered this encounter  Medications  . hydrochlorothiazide (HYDRODIURIL) 12.5 MG tablet    Sig: Take 1 tablet (12.5 mg total) by mouth daily.    Dispense:  90 tablet  Refill:  1    Fill at next patient request please.    Order Specific Question:   Supervising Provider    Answer:   Hillard Danker A [[id number]]     Follow-up: Return in about 6 months (around 01/17/2017), or if symptoms worsen or fail to improve.  Jeanine Luz, FNP

## 2017-01-17 ENCOUNTER — Ambulatory Visit: Payer: BC Managed Care – PPO | Admitting: Family

## 2017-02-25 ENCOUNTER — Other Ambulatory Visit: Payer: Self-pay | Admitting: Family

## 2017-03-28 DIAGNOSIS — Z87442 Personal history of urinary calculi: Secondary | ICD-10-CM

## 2017-03-28 HISTORY — DX: Personal history of urinary calculi: Z87.442

## 2017-05-05 ENCOUNTER — Ambulatory Visit: Payer: BC Managed Care – PPO | Admitting: Nurse Practitioner

## 2017-06-06 DIAGNOSIS — Z0289 Encounter for other administrative examinations: Secondary | ICD-10-CM

## 2017-06-07 ENCOUNTER — Ambulatory Visit: Payer: BC Managed Care – PPO | Admitting: Nurse Practitioner

## 2017-06-14 ENCOUNTER — Emergency Department (HOSPITAL_COMMUNITY): Payer: BC Managed Care – PPO

## 2017-06-14 ENCOUNTER — Emergency Department (HOSPITAL_COMMUNITY)
Admission: EM | Admit: 2017-06-14 | Discharge: 2017-06-14 | Disposition: A | Discharge status: Home / Self Care | Payer: BC Managed Care – PPO | Attending: Emergency Medicine | Admitting: Emergency Medicine

## 2017-06-14 ENCOUNTER — Encounter (HOSPITAL_COMMUNITY): Payer: Self-pay | Admitting: Emergency Medicine

## 2017-06-14 DIAGNOSIS — Z79899 Other long term (current) drug therapy: Secondary | ICD-10-CM | POA: Insufficient documentation

## 2017-06-14 DIAGNOSIS — R300 Dysuria: Secondary | ICD-10-CM | POA: Diagnosis not present

## 2017-06-14 DIAGNOSIS — Z87891 Personal history of nicotine dependence: Secondary | ICD-10-CM | POA: Insufficient documentation

## 2017-06-14 DIAGNOSIS — I10 Essential (primary) hypertension: Secondary | ICD-10-CM | POA: Diagnosis not present

## 2017-06-14 DIAGNOSIS — N200 Calculus of kidney: Secondary | ICD-10-CM | POA: Insufficient documentation

## 2017-06-14 DIAGNOSIS — R319 Hematuria, unspecified: Secondary | ICD-10-CM | POA: Diagnosis not present

## 2017-06-14 DIAGNOSIS — R110 Nausea: Secondary | ICD-10-CM | POA: Insufficient documentation

## 2017-06-14 DIAGNOSIS — R109 Unspecified abdominal pain: Secondary | ICD-10-CM | POA: Diagnosis present

## 2017-06-14 LAB — URINALYSIS, ROUTINE W REFLEX MICROSCOPIC
Bacteria, UA: NONE SEEN
Bilirubin Urine: NEGATIVE
GLUCOSE, UA: NEGATIVE mg/dL
KETONES UR: NEGATIVE mg/dL
Leukocytes, UA: NEGATIVE
NITRITE: NEGATIVE
PH: 5 (ref 5.0–8.0)
Protein, ur: NEGATIVE mg/dL
Specific Gravity, Urine: 1.011 (ref 1.005–1.030)

## 2017-06-14 LAB — COMPREHENSIVE METABOLIC PANEL
ALBUMIN: 3.7 g/dL (ref 3.5–5.0)
ALK PHOS: 62 U/L (ref 38–126)
ALT: 18 U/L (ref 14–54)
ANION GAP: 10 (ref 5–15)
AST: 20 U/L (ref 15–41)
BILIRUBIN TOTAL: 0.7 mg/dL (ref 0.3–1.2)
BUN: 12 mg/dL (ref 6–20)
CO2: 23 mmol/L (ref 22–32)
Calcium: 8.9 mg/dL (ref 8.9–10.3)
Chloride: 104 mmol/L (ref 101–111)
Creatinine, Ser: 0.78 mg/dL (ref 0.44–1.00)
GFR calc non Af Amer: >60 mL/min (ref >60)
GLUCOSE: 92 mg/dL (ref 65–99)
POTASSIUM: 3.6 mmol/L (ref 3.5–5.1)
Sodium: 137 mmol/L (ref 135–145)
TOTAL PROTEIN: 7.3 g/dL (ref 6.5–8.1)

## 2017-06-14 LAB — CBC WITH DIFFERENTIAL/PLATELET
BASOS PCT: 0 %
Basophils Absolute: 0 10*3/uL (ref 0.0–0.1)
EOS ABS: 0 10*3/uL (ref 0.0–0.7)
Eosinophils Relative: 0 %
HCT: 36.5 % (ref 36.0–46.0)
Hemoglobin: 12.3 g/dL (ref 12.0–15.0)
LYMPHS ABS: 0.6 10*3/uL — AB (ref 0.7–4.0)
Lymphocytes Relative: 8 %
MCH: 29.6 pg (ref 26.0–34.0)
MCHC: 33.7 g/dL (ref 30.0–36.0)
MCV: 88 fL (ref 78.0–100.0)
MONO ABS: 0.4 10*3/uL (ref 0.1–1.0)
MONOS PCT: 6 %
NEUTROS PCT: 86 %
Neutro Abs: 6.5 10*3/uL (ref 1.7–7.7)
Platelets: 249 10*3/uL (ref 150–400)
RBC: 4.15 MIL/uL (ref 3.87–5.11)
RDW: 13.1 % (ref 11.5–15.5)
WBC: 7.5 10*3/uL (ref 4.0–10.5)

## 2017-06-14 LAB — POC URINE PREG, ED: Preg Test, Ur: NEGATIVE

## 2017-06-14 MED ORDER — ONDANSETRON HCL 4 MG/2ML IJ SOLN
4.0000 mg | Freq: Once | INTRAMUSCULAR | Status: AC
  Administered 2017-06-14: 4 mg via INTRAVENOUS
  Filled 2017-06-14: qty 2

## 2017-06-14 MED ORDER — SODIUM CHLORIDE 0.9 % IV BOLUS (SEPSIS)
1000.0000 mL | Freq: Once | INTRAVENOUS | Status: AC
  Administered 2017-06-14: 1000 mL via INTRAVENOUS

## 2017-06-14 MED ORDER — KETOROLAC TROMETHAMINE 30 MG/ML IJ SOLN
30.0000 mg | Freq: Once | INTRAMUSCULAR | Status: AC
  Administered 2017-06-14: 30 mg via INTRAVENOUS
  Filled 2017-06-14: qty 1

## 2017-06-14 MED ORDER — ONDANSETRON 4 MG PO TBDP: 4.0000 mg | ORAL_TABLET | Freq: Three times a day (TID) | ORAL | 0 refills | Status: DC | PRN

## 2017-06-14 NOTE — ED Triage Notes (Signed)
Pt c/o right flank pain and dysuria that started Sunday and got worse and was [facility] and diagnosed with kidney stone and UTI. Was given injection in back and given medication prescriptions.  Reports pain is worse today. No blood in urine today.

## 2017-06-14 NOTE — Discharge Instructions (Signed)
It was my pleasure taking care of you today!   You have been diagnosed with kidney stones. Drink plenty of fluids to help you pass the stone.  Take ibuprofen (800mg  three times daily with food) as needed for pain. You can also alternate with Tylenol if needed for further pain relief.  Use Zofran for nausea as directed. You taking your antibiotic and Tamulosin as directed by previous provider.  Follow up with the urology clinic listed in regards to your hospital visit.   Return to the ED immediately if you develop fever that persists > 101, uncontrolled pain or vomiting, or other concerns.

## 2017-06-14 NOTE — ED Provider Notes (Signed)
[facility]-EMERGENCY DEPT Provider Note   CSN: [id number] Arrival date & time: 06/14/17  1835     History   Chief Complaint Chief Complaint  Patient presents with  . Flank Pain  . Hematuria    HPI Sandra Boone is a 32 y.o. female.  The history is provided by the patient and medical records. No language interpreter was used.  Flank Pain  Pertinent negatives include no abdominal pain.  Hematuria  Pertinent negatives include no abdominal pain.   Sandra Boone is a 32 y.o. female  with a PMH of HTN, HLD who presents to the Emergency Department complaining of dysuria associated with right-sided back and flank pain.  This started about 4 days ago.  She was seen at urgent care last night where she was told there was blood in her urine as well as urinary infection.  She was told she likely had a kidney stone and UTI.  She was given Toradol shot and started on Macrobid and Flomax.  She took a dose last night and this morning.  About 230 this afternoon, she felt very nauseous and felt as if her symptoms were worsening.  Pain to the back flank worsened as well.  She reported to emergency department for further evaluation.  She did take ibuprofen prior to arrival with little improvement.  She has noticed hematuria as well today.  Denies any fever or chills.  Denies history of similar in the past.  No vaginal discharge or concerns for STDs.   Past Medical History:  Diagnosis Date  . Frequent headaches   . History of frequent urinary tract infections   . Hyperlipidemia   . Hypertension   . Migraines     Patient Active Problem List   Diagnosis Date Noted  . Essential hypertension 06/17/2016  . Class 3 obesity due to excess calories with serious comorbidity and body mass index (BMI) of 50.0 to 59.9 in adult 06/17/2016  . Nexplanon removal 10/09/2014    Past Surgical History:  Procedure Laterality Date  . HAND SURGERY Right     OB History    Gravida Para  Term Preterm AB Living   3 3 3     3    SAB TAB Ectopic Multiple Live Births           3       Home Medications    Prior to Admission medications   Medication Sig Start Date End Date Taking? Authorizing Provider  Melatonin 5 MG TABS Take 1 tablet by mouth at bedtime as needed (sleep).   Yes [provider]  tamsulosin (FLOMAX) 0.4 MG CAPS capsule Take 0.4 mg by mouth daily.  06/13/17  Yes [provider]  hydrochlorothiazide (HYDRODIURIL) 12.5 MG tablet Take 1 tablet (12.5 mg total) by mouth daily. 07/18/16   Veryl Speakalone, Gregory D, FNP  ondansetron (ZOFRAN ODT) 4 MG disintegrating tablet Take 1 tablet (4 mg total) by mouth every 8 (eight) hours as needed for nausea or vomiting. 06/14/17   Ward, Chase PicketJaime Pilcher, PA-C    Family History Family History  Problem Relation Age of Onset  . Heart failure Maternal Grandmother   . Hypertension Maternal Grandmother   . Diabetes Maternal Grandmother   . Heart failure Maternal Grandfather   . Hyperlipidemia Maternal Grandfather   . Heart failure Mother   . Hypothyroidism Mother   . Hyperlipidemia Mother   . Hypertension Mother   . Lupus Sister   . Hypothyroidism Daughter  Social History Social History   Tobacco Use  . Smoking status: Former Smoker    Types: Cigarettes  . Smokeless tobacco: Never Used  Substance Use Topics  . Alcohol use: Yes    Comment: Rarely   . Drug use: No     Allergies   Aspirin   Review of Systems Review of Systems  Gastrointestinal: Positive for nausea. Negative for abdominal pain, constipation, diarrhea and vomiting.  Genitourinary: Positive for dysuria, flank pain and hematuria. Negative for vaginal discharge.  Musculoskeletal: Positive for back pain.  All other systems reviewed and are negative.    Physical Exam Updated Vital Signs BP (!) 174/113 (BP Location: Left Arm)   Pulse (!) 126   Temp 98.7 F (37.1 C) (Oral)   Resp (!) 26 Comment: crying   LMP 05/25/2017   SpO2 98%     Physical Exam  Constitutional: She is oriented to person, place, and time. She appears well-developed and well-nourished. No distress.  Nontoxic-appearing.  HENT:  Head: Normocephalic and atraumatic.  Neck: Neck supple.  Cardiovascular: Regular rhythm and normal heart sounds.  No murmur heard. Pulmonary/Chest: Effort normal and breath sounds normal. No respiratory distress.  Abdominal: Soft. She exhibits no distension. There is no tenderness.  Tenderness to palpation of right flank and CVA tenderness.  No abdominal tenderness.  Neurological: She is alert and oriented to person, place, and time.  Skin: Skin is warm and dry.  Nursing note and vitals reviewed.    ED Treatments / Results  Labs (all labs ordered are listed, but only abnormal results are displayed) Labs Reviewed  URINALYSIS, ROUTINE W REFLEX MICROSCOPIC - Abnormal; Notable for the following components:      Result Value   Hgb urine dipstick MODERATE (*)    Squamous Epithelial / LPF 6-30 (*)    All other components within normal limits  CBC WITH DIFFERENTIAL/PLATELET - Abnormal; Notable for the following components:   Lymphs Abs 0.6 (*)    All other components within normal limits  COMPREHENSIVE METABOLIC PANEL  POC URINE PREG, ED    EKG  EKG Interpretation None       Radiology Ct Renal Stone Study  Result Date: 06/14/2017 CLINICAL DATA:  RIGHT flank pain and dysuria, suspected stone disease, history hypertension EXAM: CT ABDOMEN AND PELVIS WITHOUT CONTRAST TECHNIQUE: Multidetector CT imaging of the abdomen and pelvis was performed following the standard protocol without IV contrast. Sagittal and coronal MPR images reconstructed from axial data set. Oral contrast was not administered. COMPARISON:  None FINDINGS: Lower chest: Lung bases clear Hepatobiliary: Gallbladder normal appearance. Probable minimal fatty infiltration of liver with focal sparing adjacent to gallbladder fossa. Pancreas: Normal in appearance  Spleen: Normal appearance Adrenals/Urinary Tract: LEFT adrenal gland normal appearance. Low-attenuation RIGHT adrenal mass 2.5 x 1.8 cm image 27 consistent with adrenal adenoma. Kidneys normal appearance without mass or hydronephrosis. No ureteral dilatation. However a small calcification 3 mm diameter is identified within the distal RIGHT ureter just above the ureterovesical junction consistent with a nonobstructing RIGHT ureteral calculus. Bladder unremarkable. Stomach/Bowel: Normal appendix. Vascular/Lymphatic: Unremarkable Reproductive: Unremarkable uterus and adnexa Other: No free air or free fluid. No hernia or inflammatory process. Musculoskeletal: Osseous structures unremarkable. IMPRESSION: 3 mm nonobstructing distal RIGHT ureteral calculus near the ureterovesical junction. 2.5 x 1.8 cm diameter RIGHT adrenal adenoma. Probable minimal fatty infiltration of liver. Electronically Signed   By: Ulyses Southward M.D.   On: 06/14/2017 19:27    Procedures Procedures (including critical care time)  Medications  Ordered in ED Medications  sodium chloride 0.9 % bolus 1,000 mL (0 mLs Intravenous Stopped 06/14/17 2047)  ketorolac (TORADOL) 30 MG/ML injection 30 mg (30 mg Intravenous Given 06/14/17 1950)  ondansetron (ZOFRAN) injection 4 mg (4 mg Intravenous Given 06/14/17 1950)     Initial Impression / Assessment and Plan / ED Course  I have reviewed the triage vital signs and the nursing notes.  Pertinent labs & imaging results that were available during my care of the patient were reviewed by me and considered in my medical decision making (see chart for details).    Sandra Boone is a 32 y.o. female who presents to ED for right flank pain and dysuria over the last several days.  Seen by urgent care and started on Macrobid and tamsulosin for UTI versus kidney stone.  In ED today, patient is nontoxic-appearing but appears very uncomfortable.  Blood work reviewed and reassuring.  Normal white count and  kidney function.  UA without any signs of infection.  Does have blood in the urine.  CT renal study shows 3 mm distal right ureteral stone near the UVJ.  Given Toradol, Zofran and fluids in ED.  Patient feels much better.  She still appears a little uncomfortable, but states that she has family history of addiction and does not want any type of narcotic pain medication.  She reports that she is comfortable enough to go home.  We discussed scheduled NSAID/Tylenol regimen and she is comfortable with this pain plan. Urology follow up provided. Return precautions discussed. All questions answered.     Final Clinical Impressions(s) / ED Diagnoses   Final diagnoses:  Kidney stone    ED Discharge Orders        Ordered    ondansetron (ZOFRAN ODT) 4 MG disintegrating tablet  Every 8 hours PRN     06/14/17 2057       Ward, Chase Picket, PA-C 06/14/17 2103    Gwyneth Sprout, MD 06/18/17 2020

## 2019-01-18 LAB — OB RESULTS CONSOLE RUBELLA ANTIBODY, IGM: Rubella: IMMUNE

## 2019-01-18 LAB — OB RESULTS CONSOLE ABO/RH: RH Type: POSITIVE

## 2019-01-18 LAB — OB RESULTS CONSOLE RPR: RPR: NONREACTIVE

## 2019-01-18 LAB — OB RESULTS CONSOLE HIV ANTIBODY (ROUTINE TESTING): HIV: NONREACTIVE

## 2019-01-18 LAB — OB RESULTS CONSOLE GC/CHLAMYDIA
Chlamydia: NEGATIVE
Gonorrhea: NEGATIVE

## 2019-01-18 LAB — OB RESULTS CONSOLE ANTIBODY SCREEN: Antibody Screen: NEGATIVE

## 2019-01-18 LAB — OB RESULTS CONSOLE HEPATITIS B SURFACE ANTIGEN: Hepatitis B Surface Ag: NEGATIVE

## 2019-02-15 ENCOUNTER — Other Ambulatory Visit (HOSPITAL_COMMUNITY): Payer: Self-pay | Admitting: Obstetrics and Gynecology

## 2019-02-15 DIAGNOSIS — Z3689 Encounter for other specified antenatal screening: Secondary | ICD-10-CM

## 2019-02-15 DIAGNOSIS — Z3A20 20 weeks gestation of pregnancy: Secondary | ICD-10-CM

## 2019-02-25 ENCOUNTER — Encounter (HOSPITAL_COMMUNITY): Payer: Self-pay | Admitting: *Deleted

## 2019-02-27 ENCOUNTER — Ambulatory Visit (HOSPITAL_COMMUNITY): Payer: BC Managed Care – PPO

## 2019-02-27 ENCOUNTER — Encounter (HOSPITAL_COMMUNITY): Payer: Self-pay

## 2019-03-07 ENCOUNTER — Other Ambulatory Visit (HOSPITAL_COMMUNITY): Payer: Self-pay | Admitting: Obstetrics and Gynecology

## 2019-03-16 ENCOUNTER — Other Ambulatory Visit (HOSPITAL_COMMUNITY): Payer: Self-pay | Admitting: Obstetrics and Gynecology

## 2019-03-20 ENCOUNTER — Other Ambulatory Visit (HOSPITAL_COMMUNITY): Payer: Self-pay | Admitting: Obstetrics and Gynecology

## 2019-03-20 ENCOUNTER — Encounter (HOSPITAL_COMMUNITY): Payer: BC Managed Care – PPO

## 2019-03-20 ENCOUNTER — Other Ambulatory Visit: Payer: Self-pay

## 2019-03-20 ENCOUNTER — Ambulatory Visit (HOSPITAL_COMMUNITY): Payer: BC Managed Care – PPO

## 2019-03-20 ENCOUNTER — Ambulatory Visit (HOSPITAL_COMMUNITY): Payer: BC Managed Care – PPO | Admitting: *Deleted

## 2019-03-20 ENCOUNTER — Encounter (HOSPITAL_COMMUNITY): Payer: Self-pay

## 2019-03-20 ENCOUNTER — Ambulatory Visit (HOSPITAL_COMMUNITY)
Admission: RE | Admit: 2019-03-20 | Discharge: 2019-03-20 | Disposition: A | Discharge status: Home / Self Care | Payer: BC Managed Care – PPO | Source: Ambulatory Visit | Attending: Obstetrics and Gynecology | Admitting: Obstetrics and Gynecology

## 2019-03-20 ENCOUNTER — Other Ambulatory Visit (HOSPITAL_COMMUNITY): Payer: Self-pay | Admitting: *Deleted

## 2019-03-20 VITALS — BP 130/71 | HR 92 | Temp 97.6°F

## 2019-03-20 DIAGNOSIS — O99212 Obesity complicating pregnancy, second trimester: Secondary | ICD-10-CM | POA: Insufficient documentation

## 2019-03-20 DIAGNOSIS — Z3689 Encounter for other specified antenatal screening: Secondary | ICD-10-CM | POA: Insufficient documentation

## 2019-03-20 DIAGNOSIS — O09522 Supervision of elderly multigravida, second trimester: Secondary | ICD-10-CM | POA: Diagnosis not present

## 2019-03-20 DIAGNOSIS — O99282 Endocrine, nutritional and metabolic diseases complicating pregnancy, second trimester: Secondary | ICD-10-CM | POA: Diagnosis not present

## 2019-03-20 DIAGNOSIS — O285 Abnormal chromosomal and genetic finding on antenatal screening of mother: Secondary | ICD-10-CM

## 2019-03-20 DIAGNOSIS — O10912 Unspecified pre-existing hypertension complicating pregnancy, second trimester: Secondary | ICD-10-CM

## 2019-03-20 DIAGNOSIS — O09292 Supervision of pregnancy with other poor reproductive or obstetric history, second trimester: Secondary | ICD-10-CM

## 2019-03-20 DIAGNOSIS — E039 Hypothyroidism, unspecified: Secondary | ICD-10-CM | POA: Insufficient documentation

## 2019-03-20 DIAGNOSIS — O351XX9 Maternal care for (suspected) chromosomal abnormality in fetus, other fetus: Secondary | ICD-10-CM | POA: Diagnosis not present

## 2019-03-20 DIAGNOSIS — O289 Unspecified abnormal findings on antenatal screening of mother: Secondary | ICD-10-CM | POA: Insufficient documentation

## 2019-03-20 DIAGNOSIS — Z3A20 20 weeks gestation of pregnancy: Secondary | ICD-10-CM | POA: Diagnosis not present

## 2019-03-20 DIAGNOSIS — E669 Obesity, unspecified: Secondary | ICD-10-CM | POA: Insufficient documentation

## 2019-03-20 DIAGNOSIS — Z886 Allergy status to analgesic agent status: Secondary | ICD-10-CM | POA: Insufficient documentation

## 2019-03-20 DIAGNOSIS — Z79899 Other long term (current) drug therapy: Secondary | ICD-10-CM | POA: Insufficient documentation

## 2019-03-20 DIAGNOSIS — O09523 Supervision of elderly multigravida, third trimester: Secondary | ICD-10-CM

## 2019-03-20 DIAGNOSIS — O10919 Unspecified pre-existing hypertension complicating pregnancy, unspecified trimester: Secondary | ICD-10-CM

## 2019-03-20 DIAGNOSIS — O10012 Pre-existing essential hypertension complicating pregnancy, second trimester: Secondary | ICD-10-CM

## 2019-03-20 DIAGNOSIS — O162 Unspecified maternal hypertension, second trimester: Secondary | ICD-10-CM | POA: Insufficient documentation

## 2019-03-20 NOTE — Consult Note (Signed)
Maternal-Fetal Medicine   Name: Sandra Boone MRN: [id number] Requesting Provider: Dr. Rhoderick Moody, MD  I had the pleasure of seeing [facility] at the [facility].  She is G4 P3003 at [redacted]weeks gestation and is here for ultrasound and consultation because of abnormal cell-free fetal DNA screening result and maternal obesity.  Patient is cell free fetal DNA screening ([employer]) that showed increased risk for fetal Down syndrome.  Obstetric history: -06/2001: Term vaginal delivery of a female infant weighing 8-1 at birth.  Patient reports her pregnancy was complicated by preeclampsia. -08/2005: Term vaginal delivery of a female infant weighing 9 pounds at birth.  Her pregnancy was uncomplicated. -08/2006: Term vaginal delivery of a female infant weighing 8-12 at birth.  Her pregnancy was uncomplicated.  GYN history: No history of abnormal Pap smears or cervical surgeries.  Patient had recently discontinued oral contraception (because of headache). Past medical history: Patient has chronic hypertension and takes labetalol 100 mg twice daily.  She also has hypothyroidism and takes levothyroxine 75 mcg daily.  She was advised to take Lovenox prophylaxis because of obesity. Past surgical history: Hand surgery. Medications: Prenatal vitamins, labetalol, levothyroxine, Lovenox 40 mg once daily. Allergies: Aspirin (hives). Social history: Denies tobacco drug or alcohol use.  Patient and her husband are Hispanic.  He is a father of all her previous children and he is in good health. Family history: No history of venous thromboembolism in the family.  I counseled the patient on the following (some comments are for the providers):  Advanced maternal age and increased risk for fetal Down syndrome on cell free fetal DNA screening: Patient understand that advanced maternal age is associated with increased risk of for fetal aneuploidies.  I explained the significance of cell free  fetal DNA screening result.  Based on the calculation (perinatalquality.org), due to positive rate is 80% and the false positive rate is 20% for fetal Down syndrome.  I informed the patient that cell free fetal DNA screening should not be considered a diagnostic test and that it is only a screening for fetal aneuploidies.  Cell free fetal DNA screening also does not detect all chromosomal abnormalities.  Informed her that only amniocentesis will give a definitive result on the fetal karyotype.  I have recommended amniocentesis.  I explained the procedure and possible complication of miscarriage (1 and 500 procedures).  I explained the availability of FISH, karyotype and MicroArray analysis. Patient informed that she had already discussed with the husband and would like to have amniocentesis procedure.  She opted to have FISH and fetal karyotype.  She will decide about MicroArray later and communicate with [place].  After informed consent, amniocentesis was performed uneventfully (see ultrasound report for details).  I informed her that cardiac anomalies are common in fetuses with Down syndrome and I recommended fetal echocardiography.   Maternal obesity: Her prepregnancy BMI was 66 and the patient has grade 3 obesity. Obesity makes ultrasound evaluation of the fetus very difficult and, thereby, fetal anomalies may go undetected. Obesity also possibly increases congenital malformations (open-neural tube defects), preterm delivery and, possibly, stillbirth.   Gestational diabetes and gestational hypertension/preeclampsia are more-commonly seen in obese pregnant women. Cesarean deliveries on obese women increase the complications of infection, wound complications, anesthesia complications and venous thromboembolism. Subcutaneous stitch should be used, and routine placement of drains is not advocated. Weight reduction is not recommended in pregnancy.  Patient reports that she is very active and ambulating.   Lovenox may be  discontinued now since she has not restricted her activities.  I discussed the findings and recommendations with Dr. Murrell Redden.  Chronic hypertension: I did not counsel chronic hypertension management in pregnancy.  Patient should continue to have fetal growth assessments every 4 weeks and weekly antenatal testing from [redacted] weeks gestation.  Recommendations: -An appointment was made for her to return in 4 weeks for completion of fetal anatomy. -Fetal growth assessment every 4 weeks. -We will set up an appointment for fetal echocardiography. -Continue labetalol. -Consider discontinuing Lovenox. -Weekly antenatal testing from [redacted] weeks gestation. -We will communicate the amniocentesis results to the patient and fax a copy to your office.  Thank you for consult. Please do not hesitate to contact me at the [facility] if you have any questions.  Consultation including face-to-face counseling 45 minutes.

## 2019-03-24 ENCOUNTER — Telehealth (HOSPITAL_COMMUNITY): Payer: Self-pay | Admitting: Obstetrics and Gynecology

## 2019-03-24 NOTE — Telephone Encounter (Signed)
I called the patient on 03/23/19. Informed her of normal FISH results. Full karyotype results to follow.

## 2019-03-29 DIAGNOSIS — F32A Depression, unspecified: Secondary | ICD-10-CM

## 2019-03-29 HISTORY — DX: Depression, unspecified: F32.A

## 2019-03-29 NOTE — L&D Delivery Note (Signed)
Delivery Note At  1631 a viable and healthy female was delivered over intact perineum via svd (Presentation: cephalic ;roa  ).  APGAR: 9,9 ; weight  not yet done.   Placenta status: delivered, intact, spontaneously .  Cord: 2vv,  with the following complications: none.  Anesthesia:  epidural Episiotomy:  none Lacerations:  none Suture Repair: n/a Est. Blood Loss (mL):   Mom to postpartum.  Baby to Couplet care / Skin to Skin.  Vick Frees 07/24/2019, 4:46 PM

## 2019-04-03 ENCOUNTER — Telehealth (HOSPITAL_COMMUNITY): Payer: Self-pay | Admitting: Genetic Counselor

## 2019-04-03 LAB — AFP, AMNIOTIC FLUID
AFP, Amniotic Fluid (mcg/ml): 6.6 ug/mL
Gestational Age(Wks): 20
MOM, Amniotic Fluid: 1.02

## 2019-04-03 LAB — INSIGHT AMNIO FISH XY,13,18,21
Cells Analyzed: 50
Cells Counted: 50

## 2019-04-03 LAB — CHROMOSOME, AMNIOTIC FLUID
Cells Analyzed: 15
Cells Counted: 15
Cells Karyotyped: 2
Colonies: 15
GTG Band Resolution Achieved: 450

## 2019-04-03 NOTE — Telephone Encounter (Signed)
Received call back from Ms. Sandra Boone to discuss her negative karyotype results. Ms. Sandra Boone had an amniocentesis performed for a noninvasive prenatal screening (NIPS) result that was high-risk for Down syndrome in the current pregnancy. Chromosomal analysisrevealed a normalmale karyotype (46,XY). Thissignificantly reducesthe possibility of a chromosomal aneuploidy such as Down syndrome, trisomy 72, or trisomy 18in this fetus, as amniocentesis is able to diagnose chromosome aneuploidies with a sensitivity of >99%. This negative result rules out both full trisomy and translocations that can cause Down syndrome.   Since there are still cells remaining from the amniocentesis, I inquired whether Ms. Sandra Boone was interested in pursuing chromosomal microarray analysis. Chromosomal microarray assesses for smaller pieces of chromosomal material that are missing or extra that fall below the detection range for karyotype. These chromosomal changes can be associated with various microdeletion or microduplication syndromes that could have impacts on the health or developmental of an affected individual. However, chromosomal microarray can also detect some microdeletion and microduplications that have unclear clinical relevance, which could potentially be anxiety-provoking for some people. After careful consideration of the benefits and limitations of chromosomal microarray, Ms. Sandra Boone declined it at this time.  Ms. Sandra Boone confirmed she had no further questions about this result. I encouraged her to call me should any additional questions arise.  Gershon Crane, MS Genetic Counselor

## 2019-04-03 NOTE — Telephone Encounter (Signed)
Attempted to call Ms. Hackmann to discuss her normal karyotype results from amniocentesis. Left message requesting a call back to my direct line to discuss good news about testing results, as no identifiers were provided in voicemail message.   Gershon Crane, MS Genetic Counselor

## 2019-04-11 ENCOUNTER — Encounter (HOSPITAL_COMMUNITY): Payer: Self-pay | Admitting: Obstetrics and Gynecology

## 2019-04-17 ENCOUNTER — Ambulatory Visit (HOSPITAL_COMMUNITY)
Admission: RE | Admit: 2019-04-17 | Discharge: 2019-04-17 | Disposition: A | Discharge status: Home / Self Care | Payer: BC Managed Care – PPO | Source: Ambulatory Visit | Attending: Obstetrics and Gynecology | Admitting: Obstetrics and Gynecology

## 2019-04-17 ENCOUNTER — Other Ambulatory Visit (HOSPITAL_COMMUNITY): Payer: Self-pay | Admitting: Genetic Counselor

## 2019-04-17 ENCOUNTER — Other Ambulatory Visit (HOSPITAL_COMMUNITY): Payer: Self-pay | Admitting: *Deleted

## 2019-04-17 ENCOUNTER — Encounter (HOSPITAL_COMMUNITY): Payer: Self-pay

## 2019-04-17 ENCOUNTER — Ambulatory Visit (HOSPITAL_COMMUNITY): Payer: BC Managed Care – PPO | Admitting: *Deleted

## 2019-04-17 ENCOUNTER — Other Ambulatory Visit: Payer: Self-pay

## 2019-04-17 VITALS — BP 129/65 | HR 80 | Temp 97.6°F

## 2019-04-17 DIAGNOSIS — Z3A24 24 weeks gestation of pregnancy: Secondary | ICD-10-CM

## 2019-04-17 DIAGNOSIS — O289 Unspecified abnormal findings on antenatal screening of mother: Secondary | ICD-10-CM

## 2019-04-17 DIAGNOSIS — O285 Abnormal chromosomal and genetic finding on antenatal screening of mother: Secondary | ICD-10-CM | POA: Insufficient documentation

## 2019-04-17 DIAGNOSIS — Z362 Encounter for other antenatal screening follow-up: Secondary | ICD-10-CM | POA: Diagnosis not present

## 2019-04-17 DIAGNOSIS — O0990 Supervision of high risk pregnancy, unspecified, unspecified trimester: Secondary | ICD-10-CM | POA: Insufficient documentation

## 2019-04-17 DIAGNOSIS — O09522 Supervision of elderly multigravida, second trimester: Secondary | ICD-10-CM

## 2019-04-17 DIAGNOSIS — O359XX0 Maternal care for (suspected) fetal abnormality and damage, unspecified, not applicable or unspecified: Secondary | ICD-10-CM

## 2019-04-17 DIAGNOSIS — Z36 Encounter for antenatal screening for chromosomal anomalies: Secondary | ICD-10-CM

## 2019-04-17 DIAGNOSIS — O09899 Supervision of other high risk pregnancies, unspecified trimester: Secondary | ICD-10-CM

## 2019-04-17 DIAGNOSIS — O99212 Obesity complicating pregnancy, second trimester: Secondary | ICD-10-CM | POA: Diagnosis not present

## 2019-04-17 DIAGNOSIS — O283 Abnormal ultrasonic finding on antenatal screening of mother: Secondary | ICD-10-CM

## 2019-04-17 DIAGNOSIS — O358XX0 Maternal care for other (suspected) fetal abnormality and damage, not applicable or unspecified: Secondary | ICD-10-CM

## 2019-04-17 DIAGNOSIS — O35EXX0 Maternal care for other (suspected) fetal abnormality and damage, fetal genitourinary anomalies, not applicable or unspecified: Secondary | ICD-10-CM

## 2019-05-15 ENCOUNTER — Ambulatory Visit (HOSPITAL_COMMUNITY)
Admission: RE | Admit: 2019-05-15 | Discharge: 2019-05-15 | Disposition: A | Discharge status: Home / Self Care | Payer: BC Managed Care – PPO | Source: Ambulatory Visit | Attending: Obstetrics and Gynecology | Admitting: Obstetrics and Gynecology

## 2019-05-15 ENCOUNTER — Ambulatory Visit (HOSPITAL_COMMUNITY): Payer: BC Managed Care – PPO | Admitting: *Deleted

## 2019-05-15 ENCOUNTER — Other Ambulatory Visit (HOSPITAL_COMMUNITY): Payer: Self-pay | Admitting: *Deleted

## 2019-05-15 ENCOUNTER — Encounter (HOSPITAL_COMMUNITY): Payer: Self-pay

## 2019-05-15 ENCOUNTER — Other Ambulatory Visit: Payer: Self-pay

## 2019-05-15 VITALS — BP 130/67 | HR 78 | Temp 97.7°F

## 2019-05-15 DIAGNOSIS — O10919 Unspecified pre-existing hypertension complicating pregnancy, unspecified trimester: Secondary | ICD-10-CM

## 2019-05-15 DIAGNOSIS — O359XX0 Maternal care for (suspected) fetal abnormality and damage, unspecified, not applicable or unspecified: Secondary | ICD-10-CM | POA: Diagnosis not present

## 2019-05-15 DIAGNOSIS — Z3A28 28 weeks gestation of pregnancy: Secondary | ICD-10-CM | POA: Diagnosis not present

## 2019-05-15 DIAGNOSIS — O0990 Supervision of high risk pregnancy, unspecified, unspecified trimester: Secondary | ICD-10-CM | POA: Insufficient documentation

## 2019-05-29 ENCOUNTER — Other Ambulatory Visit (HOSPITAL_COMMUNITY): Payer: Self-pay | Admitting: Obstetrics and Gynecology

## 2019-05-29 ENCOUNTER — Other Ambulatory Visit: Payer: Self-pay

## 2019-05-29 ENCOUNTER — Ambulatory Visit (HOSPITAL_COMMUNITY)
Admission: RE | Admit: 2019-05-29 | Discharge: 2019-05-29 | Disposition: A | Discharge status: Home / Self Care | Payer: BC Managed Care – PPO | Source: Ambulatory Visit | Attending: Obstetrics and Gynecology | Admitting: Obstetrics and Gynecology

## 2019-05-29 DIAGNOSIS — M79605 Pain in left leg: Secondary | ICD-10-CM | POA: Diagnosis present

## 2019-05-29 DIAGNOSIS — M7989 Other specified soft tissue disorders: Secondary | ICD-10-CM | POA: Insufficient documentation

## 2019-05-29 DIAGNOSIS — M79604 Pain in right leg: Secondary | ICD-10-CM

## 2019-05-29 NOTE — Progress Notes (Signed)
Left lower extremity venous duplex exam completed.  Preliminary results can be found under CV proc under chart review.  05/29/2019 2:14 PM  Chaundra Abreu, K., RDMS, RVT

## 2019-06-07 ENCOUNTER — Telehealth (HOSPITAL_COMMUNITY): Payer: Self-pay | Admitting: Genetic Counselor

## 2019-06-11 NOTE — Telephone Encounter (Signed)
I received a call from Ms. Kaczynski inquiring about chromosomal microarray analysis on her amniocentesis sample. I had called Ms. Dunkel back in January to let her know that I received prior authorization from her insurance company for chromosomal microarray should she be interested in adding on this testing. 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 Unfortunately, Ms. Cervi was unable to access my voicemail until today. She indicated that she was still interested in pursuing chromosomal microarray if possible. However, cell cultures from her amniocentesis sample were discarded by the laboratory as the culture was no longer viable to use for genetic testing. We discussed the option of postnatal chromosomal microarray analysis or additional genetic testing if clinically indicated. Ms. Grupe was interested in pursuing this. We will discuss this at our monthly Multidisciplinary Antenatal Advisory Committee meeting next week.  Ms. 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 We discussed that SUA and MCDK can be seen together, occurring sporadically due to an error in fetal development. These findings are often isolated. However, SUA and MCDK are also features mof a variety of syndromes. Chromosomal abnormalities are seen in ~3% of cases of unilateral MCDK. Ms. Cherry has already had karyotype performed on her amniocentesis sample that ruled out chromosomal aneuploidies such as Down syndrome and trisomy 74. Postnatal chromosomal microarray could detect other clinically significant microdeletions or microduplications of chromosomal material that fall below the detection threshold of karyotype. SUA and MCDK are also features of several single gene conditions. We discussed that some genetic syndromes can  be difficult to diagnose prenatally given that we are limited to what can be seen on ultrasound. If there is suspicion for a particular condition based on features that the infant displays postnatally, additional genetic testing may be indicated.  I asked Ms. Gosney if it would be helpful if I sent her some patient-friendly materials on SUA and MCDK, which she expressed interest in. Following our conversation, I emailed her several resources, including information sheets on SUA and MCDK from the [employer] of [employer] and [employer] and a Conservator, museum/gallery of MCDK from [facility]. I also informed Ms. Maduro that we could meet for a formal genetic counseling consult should she have any additional questions or be interested in discussing things in more detail. I encouraged Ms. Thackston to reach out to me at any time.  Gershon Crane, MS, [facility] Genetic Counselor

## 2019-06-12 ENCOUNTER — Other Ambulatory Visit: Payer: Self-pay

## 2019-06-12 ENCOUNTER — Ambulatory Visit (HOSPITAL_COMMUNITY): Payer: BC Managed Care – PPO | Admitting: *Deleted

## 2019-06-12 ENCOUNTER — Encounter (HOSPITAL_COMMUNITY): Payer: Self-pay

## 2019-06-12 ENCOUNTER — Ambulatory Visit (HOSPITAL_COMMUNITY)
Admission: RE | Admit: 2019-06-12 | Discharge: 2019-06-12 | Disposition: A | Discharge status: Home / Self Care | Payer: BC Managed Care – PPO | Source: Ambulatory Visit | Attending: Obstetrics and Gynecology | Admitting: Obstetrics and Gynecology

## 2019-06-12 VITALS — BP 144/89 | HR 102 | Temp 97.3°F

## 2019-06-12 DIAGNOSIS — O10919 Unspecified pre-existing hypertension complicating pregnancy, unspecified trimester: Secondary | ICD-10-CM

## 2019-06-12 DIAGNOSIS — O289 Unspecified abnormal findings on antenatal screening of mother: Secondary | ICD-10-CM

## 2019-06-12 DIAGNOSIS — Z3A32 32 weeks gestation of pregnancy: Secondary | ICD-10-CM

## 2019-06-12 DIAGNOSIS — O09523 Supervision of elderly multigravida, third trimester: Secondary | ICD-10-CM

## 2019-06-12 DIAGNOSIS — O99213 Obesity complicating pregnancy, third trimester: Secondary | ICD-10-CM

## 2019-06-12 DIAGNOSIS — O10913 Unspecified pre-existing hypertension complicating pregnancy, third trimester: Secondary | ICD-10-CM

## 2019-06-19 ENCOUNTER — Ambulatory Visit (HOSPITAL_COMMUNITY)
Admission: RE | Admit: 2019-06-19 | Discharge: 2019-06-19 | Disposition: A | Discharge status: Home / Self Care | Payer: BC Managed Care – PPO | Source: Ambulatory Visit | Attending: Obstetrics and Gynecology | Admitting: Obstetrics and Gynecology

## 2019-06-19 ENCOUNTER — Encounter (HOSPITAL_COMMUNITY): Payer: Self-pay

## 2019-06-19 ENCOUNTER — Ambulatory Visit (HOSPITAL_COMMUNITY): Payer: BC Managed Care – PPO | Admitting: *Deleted

## 2019-06-19 ENCOUNTER — Other Ambulatory Visit: Payer: Self-pay

## 2019-06-19 VITALS — BP 125/63 | HR 80 | Temp 97.2°F

## 2019-06-19 DIAGNOSIS — O99213 Obesity complicating pregnancy, third trimester: Secondary | ICD-10-CM

## 2019-06-19 DIAGNOSIS — O289 Unspecified abnormal findings on antenatal screening of mother: Secondary | ICD-10-CM

## 2019-06-19 DIAGNOSIS — Z3A33 33 weeks gestation of pregnancy: Secondary | ICD-10-CM

## 2019-06-19 DIAGNOSIS — O10013 Pre-existing essential hypertension complicating pregnancy, third trimester: Secondary | ICD-10-CM

## 2019-06-19 DIAGNOSIS — O0990 Supervision of high risk pregnancy, unspecified, unspecified trimester: Secondary | ICD-10-CM

## 2019-06-19 DIAGNOSIS — O10919 Unspecified pre-existing hypertension complicating pregnancy, unspecified trimester: Secondary | ICD-10-CM | POA: Diagnosis not present

## 2019-06-19 DIAGNOSIS — O09523 Supervision of elderly multigravida, third trimester: Secondary | ICD-10-CM

## 2019-06-26 ENCOUNTER — Ambulatory Visit (HOSPITAL_COMMUNITY)
Admission: RE | Admit: 2019-06-26 | Discharge: 2019-06-26 | Disposition: A | Discharge status: Home / Self Care | Payer: BC Managed Care – PPO | Source: Ambulatory Visit | Attending: Obstetrics and Gynecology | Admitting: Obstetrics and Gynecology

## 2019-06-26 ENCOUNTER — Other Ambulatory Visit (HOSPITAL_COMMUNITY): Payer: Self-pay | Admitting: Obstetrics

## 2019-06-26 ENCOUNTER — Ambulatory Visit (HOSPITAL_COMMUNITY): Payer: BC Managed Care – PPO | Admitting: *Deleted

## 2019-06-26 ENCOUNTER — Encounter (HOSPITAL_COMMUNITY): Payer: Self-pay

## 2019-06-26 ENCOUNTER — Other Ambulatory Visit: Payer: Self-pay

## 2019-06-26 ENCOUNTER — Other Ambulatory Visit (HOSPITAL_COMMUNITY): Payer: Self-pay | Admitting: *Deleted

## 2019-06-26 VITALS — BP 136/63 | HR 79 | Temp 97.3°F

## 2019-06-26 DIAGNOSIS — Z3A34 34 weeks gestation of pregnancy: Secondary | ICD-10-CM

## 2019-06-26 DIAGNOSIS — O289 Unspecified abnormal findings on antenatal screening of mother: Secondary | ICD-10-CM

## 2019-06-26 DIAGNOSIS — O09523 Supervision of elderly multigravida, third trimester: Secondary | ICD-10-CM

## 2019-06-26 DIAGNOSIS — O10919 Unspecified pre-existing hypertension complicating pregnancy, unspecified trimester: Secondary | ICD-10-CM | POA: Diagnosis present

## 2019-06-26 DIAGNOSIS — O99213 Obesity complicating pregnancy, third trimester: Secondary | ICD-10-CM | POA: Diagnosis not present

## 2019-06-26 DIAGNOSIS — O10913 Unspecified pre-existing hypertension complicating pregnancy, third trimester: Secondary | ICD-10-CM

## 2019-06-26 NOTE — Procedures (Signed)
Alleta DEEMA JUNCAJ 1986/01/26 [redacted]w[redacted]d  Fetus A Non-Stress Test Interpretation for 06/26/19  Indication: Unsatisfactory BPP  Fetal Heart Rate A Mode: External Baseline Rate (A): 130 bpm Variability: Moderate Accelerations: 15 x 15 Decelerations: None Multiple birth?: No  Uterine Activity Mode: Palpation, Toco Contraction Frequency (min): U/I Contraction Duration (sec): 40-50 Contraction Quality: Mild Resting Tone Palpated: Relaxed Resting Time: Adequate  Interpretation (Fetal Testing) Nonstress Test Interpretation: Reactive Comments: Reviewed tracing with Dr. Judeth Cornfield

## 2019-07-03 ENCOUNTER — Other Ambulatory Visit: Payer: Self-pay

## 2019-07-03 ENCOUNTER — Ambulatory Visit (HOSPITAL_COMMUNITY)
Admission: RE | Admit: 2019-07-03 | Discharge: 2019-07-03 | Disposition: A | Discharge status: Home / Self Care | Payer: BC Managed Care – PPO | Source: Ambulatory Visit | Attending: Obstetrics and Gynecology | Admitting: Obstetrics and Gynecology

## 2019-07-03 ENCOUNTER — Encounter (HOSPITAL_COMMUNITY): Payer: Self-pay

## 2019-07-03 ENCOUNTER — Ambulatory Visit (HOSPITAL_COMMUNITY): Payer: BC Managed Care – PPO | Admitting: *Deleted

## 2019-07-03 VITALS — BP 132/89 | HR 88 | Temp 97.5°F

## 2019-07-03 DIAGNOSIS — O09523 Supervision of elderly multigravida, third trimester: Secondary | ICD-10-CM

## 2019-07-03 DIAGNOSIS — O289 Unspecified abnormal findings on antenatal screening of mother: Secondary | ICD-10-CM | POA: Diagnosis not present

## 2019-07-03 DIAGNOSIS — O99213 Obesity complicating pregnancy, third trimester: Secondary | ICD-10-CM

## 2019-07-03 DIAGNOSIS — O0990 Supervision of high risk pregnancy, unspecified, unspecified trimester: Secondary | ICD-10-CM | POA: Diagnosis present

## 2019-07-03 DIAGNOSIS — O10913 Unspecified pre-existing hypertension complicating pregnancy, third trimester: Secondary | ICD-10-CM

## 2019-07-03 DIAGNOSIS — Z3A35 35 weeks gestation of pregnancy: Secondary | ICD-10-CM

## 2019-07-03 DIAGNOSIS — O10013 Pre-existing essential hypertension complicating pregnancy, third trimester: Secondary | ICD-10-CM | POA: Diagnosis not present

## 2019-07-10 ENCOUNTER — Encounter (HOSPITAL_COMMUNITY): Payer: Self-pay

## 2019-07-10 ENCOUNTER — Other Ambulatory Visit: Payer: Self-pay

## 2019-07-10 ENCOUNTER — Ambulatory Visit (HOSPITAL_COMMUNITY)
Admission: RE | Admit: 2019-07-10 | Discharge: 2019-07-10 | Disposition: A | Discharge status: Home / Self Care | Payer: BC Managed Care – PPO | Source: Ambulatory Visit | Attending: Obstetrics and Gynecology | Admitting: Obstetrics and Gynecology

## 2019-07-10 ENCOUNTER — Ambulatory Visit (HOSPITAL_COMMUNITY): Payer: BC Managed Care – PPO | Admitting: *Deleted

## 2019-07-10 VITALS — BP 122/56 | HR 89 | Temp 97.1°F

## 2019-07-10 DIAGNOSIS — O99210 Obesity complicating pregnancy, unspecified trimester: Secondary | ICD-10-CM | POA: Diagnosis not present

## 2019-07-10 DIAGNOSIS — O10919 Unspecified pre-existing hypertension complicating pregnancy, unspecified trimester: Secondary | ICD-10-CM | POA: Diagnosis present

## 2019-07-10 DIAGNOSIS — O09523 Supervision of elderly multigravida, third trimester: Secondary | ICD-10-CM

## 2019-07-10 DIAGNOSIS — O10913 Unspecified pre-existing hypertension complicating pregnancy, third trimester: Secondary | ICD-10-CM | POA: Diagnosis present

## 2019-07-10 DIAGNOSIS — Z3A36 36 weeks gestation of pregnancy: Secondary | ICD-10-CM

## 2019-07-11 ENCOUNTER — Other Ambulatory Visit (HOSPITAL_COMMUNITY): Payer: Self-pay | Admitting: *Deleted

## 2019-07-11 DIAGNOSIS — O10919 Unspecified pre-existing hypertension complicating pregnancy, unspecified trimester: Secondary | ICD-10-CM

## 2019-07-12 LAB — OB RESULTS CONSOLE GBS: GBS: NEGATIVE

## 2019-07-18 ENCOUNTER — Other Ambulatory Visit: Payer: Self-pay

## 2019-07-18 ENCOUNTER — Ambulatory Visit (HOSPITAL_COMMUNITY): Payer: BC Managed Care – PPO | Admitting: *Deleted

## 2019-07-18 ENCOUNTER — Encounter (HOSPITAL_COMMUNITY): Payer: Self-pay

## 2019-07-18 ENCOUNTER — Ambulatory Visit (HOSPITAL_COMMUNITY)
Admission: RE | Admit: 2019-07-18 | Discharge: 2019-07-18 | Disposition: A | Discharge status: Home / Self Care | Payer: BC Managed Care – PPO | Source: Ambulatory Visit | Attending: Obstetrics and Gynecology | Admitting: Obstetrics and Gynecology

## 2019-07-18 VITALS — BP 127/74 | HR 96 | Temp 97.4°F

## 2019-07-18 DIAGNOSIS — O289 Unspecified abnormal findings on antenatal screening of mother: Secondary | ICD-10-CM | POA: Diagnosis not present

## 2019-07-18 DIAGNOSIS — O10919 Unspecified pre-existing hypertension complicating pregnancy, unspecified trimester: Secondary | ICD-10-CM

## 2019-07-18 DIAGNOSIS — O10913 Unspecified pre-existing hypertension complicating pregnancy, third trimester: Secondary | ICD-10-CM

## 2019-07-18 DIAGNOSIS — O99213 Obesity complicating pregnancy, third trimester: Secondary | ICD-10-CM | POA: Diagnosis not present

## 2019-07-18 DIAGNOSIS — Z3A37 37 weeks gestation of pregnancy: Secondary | ICD-10-CM

## 2019-07-18 DIAGNOSIS — O09523 Supervision of elderly multigravida, third trimester: Secondary | ICD-10-CM | POA: Diagnosis not present

## 2019-07-19 ENCOUNTER — Other Ambulatory Visit: Payer: Self-pay | Admitting: Obstetrics and Gynecology

## 2019-07-19 ENCOUNTER — Telehealth (HOSPITAL_COMMUNITY): Payer: Self-pay | Admitting: *Deleted

## 2019-07-19 ENCOUNTER — Encounter (HOSPITAL_COMMUNITY): Payer: Self-pay | Admitting: *Deleted

## 2019-07-19 NOTE — Telephone Encounter (Signed)
Preadmission screen  

## 2019-07-22 ENCOUNTER — Other Ambulatory Visit (HOSPITAL_COMMUNITY)
Admission: RE | Admit: 2019-07-22 | Discharge: 2019-07-22 | Disposition: A | Discharge status: Home / Self Care | Payer: BC Managed Care – PPO | Source: Ambulatory Visit | Attending: Obstetrics and Gynecology | Admitting: Obstetrics and Gynecology

## 2019-07-22 DIAGNOSIS — Z20822 Contact with and (suspected) exposure to covid-19: Secondary | ICD-10-CM | POA: Insufficient documentation

## 2019-07-22 DIAGNOSIS — Z01812 Encounter for preprocedural laboratory examination: Secondary | ICD-10-CM | POA: Insufficient documentation

## 2019-07-22 LAB — SARS CORONAVIRUS 2 (TAT 6-24 HRS): SARS Coronavirus 2: NEGATIVE

## 2019-07-23 ENCOUNTER — Other Ambulatory Visit: Payer: Self-pay | Admitting: Obstetrics and Gynecology

## 2019-07-24 ENCOUNTER — Inpatient Hospital Stay (HOSPITAL_COMMUNITY)
Admission: AD | Admit: 2019-07-24 | Discharge: 2019-07-26 | DRG: 807 | Disposition: A | Discharge status: Home / Self Care | Payer: BC Managed Care – PPO | Attending: Obstetrics and Gynecology | Admitting: Obstetrics and Gynecology

## 2019-07-24 ENCOUNTER — Inpatient Hospital Stay (HOSPITAL_COMMUNITY): Payer: BC Managed Care – PPO | Admitting: Anesthesiology

## 2019-07-24 ENCOUNTER — Encounter (HOSPITAL_COMMUNITY): Payer: Self-pay | Admitting: Obstetrics and Gynecology

## 2019-07-24 ENCOUNTER — Inpatient Hospital Stay (HOSPITAL_COMMUNITY): Payer: BC Managed Care – PPO

## 2019-07-24 ENCOUNTER — Other Ambulatory Visit: Payer: Self-pay

## 2019-07-24 DIAGNOSIS — O99284 Endocrine, nutritional and metabolic diseases complicating childbirth: Secondary | ICD-10-CM | POA: Diagnosis present

## 2019-07-24 DIAGNOSIS — Z87891 Personal history of nicotine dependence: Secondary | ICD-10-CM | POA: Diagnosis not present

## 2019-07-24 DIAGNOSIS — E039 Hypothyroidism, unspecified: Secondary | ICD-10-CM | POA: Diagnosis present

## 2019-07-24 DIAGNOSIS — O1002 Pre-existing essential hypertension complicating childbirth: Secondary | ICD-10-CM | POA: Diagnosis present

## 2019-07-24 DIAGNOSIS — Z3490 Encounter for supervision of normal pregnancy, unspecified, unspecified trimester: Secondary | ICD-10-CM | POA: Diagnosis present

## 2019-07-24 DIAGNOSIS — I10 Essential (primary) hypertension: Secondary | ICD-10-CM

## 2019-07-24 DIAGNOSIS — S86819A Strain of other muscle(s) and tendon(s) at lower leg level, unspecified leg, initial encounter: Secondary | ICD-10-CM | POA: Diagnosis present

## 2019-07-24 DIAGNOSIS — Z3A38 38 weeks gestation of pregnancy: Secondary | ICD-10-CM | POA: Diagnosis not present

## 2019-07-24 DIAGNOSIS — O99214 Obesity complicating childbirth: Secondary | ICD-10-CM | POA: Diagnosis present

## 2019-07-24 DIAGNOSIS — O26893 Other specified pregnancy related conditions, third trimester: Secondary | ICD-10-CM | POA: Diagnosis present

## 2019-07-24 DIAGNOSIS — O358XX0 Maternal care for other (suspected) fetal abnormality and damage, not applicable or unspecified: Secondary | ICD-10-CM | POA: Diagnosis present

## 2019-07-24 DIAGNOSIS — Z20822 Contact with and (suspected) exposure to covid-19: Secondary | ICD-10-CM | POA: Diagnosis present

## 2019-07-24 LAB — COMPREHENSIVE METABOLIC PANEL
ALT: 20 U/L (ref 0–44)
AST: 21 U/L (ref 15–41)
Albumin: 2.4 g/dL — ABNORMAL LOW (ref 3.5–5.0)
Alkaline Phosphatase: 179 U/L — ABNORMAL HIGH (ref 38–126)
Anion gap: 11 (ref 5–15)
BUN: 16 mg/dL (ref 6–20)
CO2: 21 mmol/L — ABNORMAL LOW (ref 22–32)
Calcium: 8.9 mg/dL (ref 8.9–10.3)
Chloride: 106 mmol/L (ref 98–111)
Creatinine, Ser: 0.7 mg/dL (ref 0.44–1.00)
GFR calc Af Amer: >60 mL/min (ref >60)
GFR calc non Af Amer: >60 mL/min (ref >60)
Glucose, Bld: 97 mg/dL (ref 70–99)
Potassium: 4 mmol/L (ref 3.5–5.1)
Sodium: 138 mmol/L (ref 135–145)
Total Bilirubin: 0.5 mg/dL (ref 0.3–1.2)
Total Protein: 6 g/dL — ABNORMAL LOW (ref 6.5–8.1)

## 2019-07-24 LAB — CBC
HCT: 39.5 % (ref 36.0–46.0)
HCT: 40 % (ref 36.0–46.0)
Hemoglobin: 12.8 g/dL (ref 12.0–15.0)
Hemoglobin: 12.8 g/dL (ref 12.0–15.0)
MCH: 28.4 pg (ref 26.0–34.0)
MCH: 28.3 pg (ref 26.0–34.0)
MCHC: 32.4 g/dL (ref 30.0–36.0)
MCHC: 32 g/dL (ref 30.0–36.0)
MCV: 87.8 fL (ref 80.0–100.0)
MCV: 88.5 fL (ref 80.0–100.0)
Platelets: 301 10*3/uL (ref 150–400)
Platelets: 292 10*3/uL (ref 150–400)
RBC: 4.5 MIL/uL (ref 3.87–5.11)
RBC: 4.52 MIL/uL (ref 3.87–5.11)
RDW: 16 % — ABNORMAL HIGH (ref 11.5–15.5)
RDW: 15.9 % — ABNORMAL HIGH (ref 11.5–15.5)
WBC: 11 10*3/uL — ABNORMAL HIGH (ref 4.0–10.5)
WBC: 11.8 10*3/uL — ABNORMAL HIGH (ref 4.0–10.5)
nRBC: 0 % (ref 0.0–0.2)
nRBC: 0 % (ref 0.0–0.2)

## 2019-07-24 LAB — ABO/RH: ABO/RH(D): A POS

## 2019-07-24 LAB — PROTEIN / CREATININE RATIO, URINE
Creatinine, Urine: 116.6 mg/dL
Protein Creatinine Ratio: 0.17 mg/mg{Cre} — ABNORMAL HIGH (ref 0.00–0.15)
Total Protein, Urine: 20 mg/dL

## 2019-07-24 LAB — TYPE AND SCREEN
ABO/RH(D): A POS
Antibody Screen: NEGATIVE

## 2019-07-24 LAB — RPR: RPR Ser Ql: NONREACTIVE

## 2019-07-24 MED ORDER — ONDANSETRON HCL 4 MG/2ML IJ SOLN: 4.0000 mg | Freq: Four times a day (QID) | INTRAMUSCULAR | Status: DC | PRN

## 2019-07-24 MED ORDER — ONDANSETRON HCL 4 MG PO TABS: 4.0000 mg | ORAL_TABLET | ORAL | Status: DC | PRN

## 2019-07-24 MED ORDER — EPHEDRINE 5 MG/ML INJ
10.0000 mg | INTRAVENOUS | Status: DC | PRN
  Administered 2019-07-24: 10 mg via INTRAVENOUS

## 2019-07-24 MED ORDER — DIPHENHYDRAMINE HCL 25 MG PO CAPS: 25.0000 mg | ORAL_CAPSULE | Freq: Four times a day (QID) | ORAL | Status: DC | PRN

## 2019-07-24 MED ORDER — OXYTOCIN BOLUS FROM INFUSION
500.0000 mL | Freq: Once | INTRAVENOUS | Status: AC
  Administered 2019-07-24: 500 mL via INTRAVENOUS

## 2019-07-24 MED ORDER — BENZOCAINE-MENTHOL 20-0.5 % EX AERO
1.0000 "application " | INHALATION_SPRAY | CUTANEOUS | Status: DC | PRN
  Administered 2019-07-24: 1 via TOPICAL
  Filled 2019-07-24: qty 56

## 2019-07-24 MED ORDER — ACETAMINOPHEN 325 MG PO TABS
650.0000 mg | ORAL_TABLET | ORAL | Status: DC | PRN
  Administered 2019-07-26: 650 mg via ORAL
  Filled 2019-07-24 (×2): qty 2

## 2019-07-24 MED ORDER — WITCH HAZEL-GLYCERIN EX PADS: 1.0000 "application " | MEDICATED_PAD | CUTANEOUS | Status: DC | PRN

## 2019-07-24 MED ORDER — LACTATED RINGERS IV SOLN: INTRAVENOUS | Status: DC

## 2019-07-24 MED ORDER — TERBUTALINE SULFATE 1 MG/ML IJ SOLN: 0.2500 mg | Freq: Once | INTRAMUSCULAR | Status: DC | PRN

## 2019-07-24 MED ORDER — HYDRALAZINE HCL 20 MG/ML IJ SOLN: 10.0000 mg | INTRAMUSCULAR | Status: DC | PRN

## 2019-07-24 MED ORDER — LABETALOL HCL 200 MG PO TABS
200.0000 mg | ORAL_TABLET | Freq: Three times a day (TID) | ORAL | Status: DC
  Administered 2019-07-24: 09:00:00 200 mg via ORAL
  Filled 2019-07-24: qty 1

## 2019-07-24 MED ORDER — EPHEDRINE 5 MG/ML INJ
10.0000 mg | INTRAVENOUS | Status: DC | PRN
  Filled 2019-07-24: qty 10

## 2019-07-24 MED ORDER — PHENYLEPHRINE 40 MCG/ML (10ML) SYRINGE FOR IV PUSH (FOR BLOOD PRESSURE SUPPORT)
80.0000 ug | PREFILLED_SYRINGE | INTRAVENOUS | Status: DC | PRN
  Administered 2019-07-24 (×2): 80 ug via INTRAVENOUS

## 2019-07-24 MED ORDER — SIMETHICONE 80 MG PO CHEW: 80.0000 mg | CHEWABLE_TABLET | ORAL | Status: DC | PRN

## 2019-07-24 MED ORDER — OXYTOCIN 40 UNITS IN NORMAL SALINE INFUSION - SIMPLE MED
INTRAVENOUS | Status: AC
  Filled 2019-07-24: qty 1000

## 2019-07-24 MED ORDER — LABETALOL HCL 5 MG/ML IV SOLN: 40.0000 mg | INTRAVENOUS | Status: DC | PRN

## 2019-07-24 MED ORDER — LIDOCAINE HCL (PF) 1 % IJ SOLN
INTRAMUSCULAR | Status: DC | PRN
  Administered 2019-07-24 (×2): 4 mL via EPIDURAL

## 2019-07-24 MED ORDER — ONDANSETRON HCL 4 MG/2ML IJ SOLN: 4.0000 mg | INTRAMUSCULAR | Status: DC | PRN

## 2019-07-24 MED ORDER — LACTATED RINGERS IV SOLN
500.0000 mL | INTRAVENOUS | Status: DC | PRN
  Administered 2019-07-24: 500 mL via INTRAVENOUS

## 2019-07-24 MED ORDER — LABETALOL HCL 5 MG/ML IV SOLN: 80.0000 mg | INTRAVENOUS | Status: DC | PRN

## 2019-07-24 MED ORDER — PHENYLEPHRINE 40 MCG/ML (10ML) SYRINGE FOR IV PUSH (FOR BLOOD PRESSURE SUPPORT)
80.0000 ug | PREFILLED_SYRINGE | INTRAVENOUS | Status: DC | PRN
  Filled 2019-07-24: qty 10

## 2019-07-24 MED ORDER — OXYTOCIN 40 UNITS IN NORMAL SALINE INFUSION - SIMPLE MED
2.5000 [IU]/h | Freq: Once | INTRAVENOUS | Status: AC
  Administered 2019-07-24: 20:00:00 2.5 [IU]/h via INTRAVENOUS

## 2019-07-24 MED ORDER — DIBUCAINE (PERIANAL) 1 % EX OINT: 1.0000 "application " | TOPICAL_OINTMENT | CUTANEOUS | Status: DC | PRN

## 2019-07-24 MED ORDER — IBUPROFEN 600 MG PO TABS
600.0000 mg | ORAL_TABLET | Freq: Four times a day (QID) | ORAL | Status: DC
  Administered 2019-07-24 – 2019-07-26 (×7): 600 mg via ORAL
  Filled 2019-07-24 (×7): qty 1

## 2019-07-24 MED ORDER — OXYTOCIN 40 UNITS IN NORMAL SALINE INFUSION - SIMPLE MED
1.0000 m[IU]/min | INTRAVENOUS | Status: DC
  Administered 2019-07-24: 1 m[IU]/min via INTRAVENOUS
  Filled 2019-07-24: qty 1000

## 2019-07-24 MED ORDER — PRENATAL MULTIVITAMIN CH
1.0000 | ORAL_TABLET | Freq: Every day | ORAL | Status: DC
  Administered 2019-07-25 – 2019-07-26 (×2): 1 via ORAL
  Filled 2019-07-24 (×2): qty 1

## 2019-07-24 MED ORDER — COCONUT OIL OIL: 1.0000 "application " | TOPICAL_OIL | Status: DC | PRN

## 2019-07-24 MED ORDER — ACETAMINOPHEN 325 MG PO TABS: 650.0000 mg | ORAL_TABLET | ORAL | Status: DC | PRN

## 2019-07-24 MED ORDER — LEVOTHYROXINE SODIUM 75 MCG PO TABS
125.0000 ug | ORAL_TABLET | Freq: Every day | ORAL | Status: DC
  Administered 2019-07-25 – 2019-07-26 (×2): 125 ug via ORAL
  Filled 2019-07-24 (×2): qty 1

## 2019-07-24 MED ORDER — LACTATED RINGERS IV SOLN: 500.0000 mL | Freq: Once | INTRAVENOUS | Status: DC

## 2019-07-24 MED ORDER — FENTANYL-BUPIVACAINE-NACL 0.5-0.125-0.9 MG/250ML-% EP SOLN
12.0000 mL/h | EPIDURAL | Status: DC | PRN
  Filled 2019-07-24: qty 250

## 2019-07-24 MED ORDER — DIPHENHYDRAMINE HCL 50 MG/ML IJ SOLN: 12.5000 mg | INTRAMUSCULAR | Status: DC | PRN

## 2019-07-24 MED ORDER — OXYTOCIN 40 UNITS IN NORMAL SALINE INFUSION - SIMPLE MED: 1.0000 m[IU]/min | INTRAVENOUS | Status: DC

## 2019-07-24 MED ORDER — TETANUS-DIPHTH-ACELL PERTUSSIS 5-2.5-18.5 LF-MCG/0.5 IM SUSP: 0.5000 mL | Freq: Once | INTRAMUSCULAR | Status: DC

## 2019-07-24 MED ORDER — LIDOCAINE HCL (PF) 1 % IJ SOLN: 30.0000 mL | INTRAMUSCULAR | Status: DC | PRN

## 2019-07-24 MED ORDER — SODIUM CHLORIDE (PF) 0.9 % IJ SOLN
INTRAMUSCULAR | Status: DC | PRN
  Administered 2019-07-24: 12 mL/h via EPIDURAL

## 2019-07-24 MED ORDER — SOD CITRATE-CITRIC ACID 500-334 MG/5ML PO SOLN: 30.0000 mL | ORAL | Status: DC | PRN

## 2019-07-24 MED ORDER — LABETALOL HCL 200 MG PO TABS
200.0000 mg | ORAL_TABLET | Freq: Three times a day (TID) | ORAL | Status: DC
  Administered 2019-07-25 – 2019-07-26 (×4): 200 mg via ORAL
  Filled 2019-07-24 (×5): qty 1

## 2019-07-24 MED ORDER — SENNOSIDES-DOCUSATE SODIUM 8.6-50 MG PO TABS
2.0000 | ORAL_TABLET | ORAL | Status: DC
  Administered 2019-07-24 – 2019-07-25 (×2): 2 via ORAL
  Filled 2019-07-24 (×2): qty 2

## 2019-07-24 MED ORDER — OXYTOCIN 40 UNITS IN NORMAL SALINE INFUSION - SIMPLE MED
2.5000 [IU]/h | INTRAVENOUS | Status: DC
  Administered 2019-07-24: 17:00:00 2.5 [IU]/h via INTRAVENOUS

## 2019-07-24 MED ORDER — LABETALOL HCL 5 MG/ML IV SOLN
20.0000 mg | INTRAVENOUS | Status: DC | PRN
  Administered 2019-07-24: 13:00:00 20 mg via INTRAVENOUS
  Filled 2019-07-24: qty 4

## 2019-07-24 NOTE — Plan of Care (Signed)
Savion Washam, RN 

## 2019-07-24 NOTE — H&P (Addendum)
Sandra Boone is a 34 y.o. [phone] at [redacted]w[redacted]d gestation presents for IOL.  Denies ctx, vb, lof.  +FM  Antepartum course: chronic hypertension and bmi >60, ama, abnml NIPT with normal amnio, hypoplastic left fetal kidney, ama, nml antenatal testing, efw 65%, hypothyroid; pt has had serial growth u/s and wkly ante testing with [facility] at [facility] since 11 wks.  See complete pre-natal records  History OB History    Gravida  4   Para  3   Term  3   Preterm      AB      Living  3     SAB      TAB      Ectopic      Multiple      Live Births  3          Past Medical History:  Diagnosis Date  . Frequent headaches   . History of frequent urinary tract infections   . Hyperlipidemia   . Hypertension   . Hypothyroidism   . Migraines    Past Surgical History:  Procedure Laterality Date  . HAND SURGERY Right    Family History: family history includes Diabetes in her maternal grandmother; Heart failure in her maternal grandfather, maternal grandmother, and mother; Hyperlipidemia in her maternal grandfather and mother; Hypertension in her maternal grandmother and mother; Hypothyroidism in her daughter and mother; Lupus in her sister. Social History:  reports that she has quit smoking. Her smoking use included cigarettes. She has never used smokeless tobacco. She reports previous alcohol use. She reports that she does not use drugs.  ROS: See above otherwise negative  Prenatal labs:  ABO, Rh: --/--/A POS, A POS Performed at [facility], [address], [place], [place] [address]  [phone]/28 0100) Antibody: NEG (04/28 0100) Rubella: Immune (10/23 0000) RPR: NON REACTIVE (04/28 0105)  HBsAg: Negative (10/23 0000)  HIV:Non-reactive (10/23 0000)  GBS: Negative/-- (04/16 0000)  1 hr Glucola: Normal Genetic screening: abnormal NIPT, normal amniocentesis Anatomy US: abnormal left kidney  Physical Exam:   Dilation: 5 Effacement (%): 60 Station: -2 Exam  by:: Earlene Plater, RN  Blood pressure (!) 166/89, pulse 69, temperature 98.1 F (36.7 C), temperature source Oral, resp. rate 18, height 5\' 4"  (1.626 m), weight (!) 171.5 kg, last menstrual period 10/31/2018. A&O x 3 HEENT: Normal Lungs: CTAB CV: RRR Abdominal: Soft, Non-tender, Gravid and Estimated fetal weight:  7lbs lbs   Lower Extremities: trace to +1 edema, Non-tender  Pelvic Exam:      Dilatation: 5cm     Effacement: 60%     Station: -2     Presentation: Cephalic; s/p srom, thin meconium noted, pt unaware of srom; fse placed  Labs:  CBC:  Lab Results  Component Value Date   WBC 11.8 (H) 07/24/2019   RBC 4.52 07/24/2019   HGB 12.8 07/24/2019   HCT 40.0 07/24/2019   MCV 88.5 07/24/2019   MCH 28.3 07/24/2019   MCHC 32.0 07/24/2019   RDW 15.9 (H) 07/24/2019   PLT 292 07/24/2019   CMP:  Lab Results  Component Value Date   NA 138 07/24/2019   K 4.0 07/24/2019   CL 106 07/24/2019   CO2 21 (L) 07/24/2019   GLUCOSE 97 07/24/2019   BUN 16 07/24/2019   CREATININE 0.70 07/24/2019   CALCIUM 8.9 07/24/2019   PROT 6.0 (L) 07/24/2019   AST 21 07/24/2019   ALT 20 07/24/2019   ALBUMIN 2.4 (L) 07/24/2019  ALKPHOS 179 (H) 07/24/2019   BILITOT 0.5 07/24/2019   GFRNONAA >60 07/24/2019   GFRAA >60 07/24/2019   ANIONGAP 11 07/24/2019   Urine: Lab Results  Component Value Date   COLORURINE YELLOW 06/14/2017   APPEARANCEUR CLEAR 06/14/2017   LABSPEC 1.011 06/14/2017   PHURINE 5.0 06/14/2017   GLUCOSEU NEGATIVE 06/14/2017   HGBUR MODERATE (A) 06/14/2017   BILIRUBINUR NEGATIVE 06/14/2017   KETONESUR NEGATIVE 06/14/2017   PROTEINUR NEGATIVE 06/14/2017   NITRITE NEGATIVE 06/14/2017   LEUKOCYTESUR NEGATIVE 06/14/2017     Prenatal Transfer Tool  Maternal Diabetes: No Genetic Screening: Abnormal:  Results: Elevated risk of Trisomy 21, Normal amniocentesis Maternal Ultrasounds/Referrals: Other:left kidney hypoplastic/multicystic Fetal Ultrasounds or other Referrals:   None Maternal Substance Abuse:  No Significant Maternal Medications:  Meds include: Other: labetalol, synthroid Significant Maternal Lab Results: Group B Strep negative  FHT:120s, nml variability, +accels, occ variables, occ late decels 30sec and back to baseline, not repetative; returned to room d/t  Bradycardia; prolonged decel with fht range from nadir of 70s to 110, return to baseline after 11 min of fetal bradycardia, nml variability TOCO: about q 2  Assessment/Plan:  34 y.o. [id number] at [redacted]w[redacted]d gestation   1. IOL - contin pitocin, s/p balloon catheter;  Has made good cervical change, now with cat 2 tracing, guarded MOD, hold pitocin until reassuring FHT persists 2. Fetal status - suspect prolonged decel was d/t significant drop in BP d/t epidural, from mild range bp to 119/70, phenylephrine given x2, pitocin stopped; will monitor closely, plan to resume pitocin after reassuring fht at least 64min 3. 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 Ama, abnml nipt for increased ds risk, nml amniocentesis 5. Left multicystic/dysplastic kidney 6. Hypothyroid, synthroid, last increased 4 wks ago 7. gbs neg 8. bmi >60  9. Ruptured Baker's cyst, stable, ambulates with crutches   Charyl Bigger 07/24/2019, 1:31 PM

## 2019-07-24 NOTE — Progress Notes (Signed)
Patient ID: Sandra Boone, female   DOB: Jun 06, 1985, 34 y.o.   MRN: [id number] G4P3 with h/o CHTN for IOL Pregnancy complicated by NL amnio and morbid obesity BP (!) 121/56   Pulse 71   Resp 18   Ht 5\' 4"  (1.626 m)   Wt (!) 171.5 kg   LMP 10/31/2018   BMI 64.88 kg/m   FHR tracing initially with poor BTBV and intermittent late decels now after IVF markedly improved with BTBV 5-25 and 15x15 accels. No  decels noted.  Category 1 tracing Titrate Pitocin slowly. Consider Cervical Foley.

## 2019-07-24 NOTE — Anesthesia Preprocedure Evaluation (Signed)
Anesthesia Evaluation  Patient identified by MRN, date of birth, ID band Patient awake    Reviewed: Allergy & Precautions, Patient's Chart, lab work & pertinent test results  Airway Mallampati: III  TM Distance: >3 FB Neck ROM: Full    Dental   Pulmonary neg pulmonary ROS, former smoker,    Pulmonary exam normal breath sounds clear to auscultation       Cardiovascular hypertension, Normal cardiovascular exam Rhythm:Regular Rate:Normal     Neuro/Psych  Headaches, negative psych ROS   GI/Hepatic negative GI ROS, Neg liver ROS,   Endo/Other  Hypothyroidism Morbid obesity  Renal/GU negative Renal ROS     Musculoskeletal negative musculoskeletal ROS (+)   Abdominal (+) + obese,   Peds  Hematology negative hematology ROS (+)   Anesthesia Other Findings   Reproductive/Obstetrics (+) Pregnancy                             Anesthesia Physical Anesthesia Plan  ASA: IV  Anesthesia Plan: Epidural   Post-op Pain Management:    Induction:   PONV Risk Score and Plan:   Airway Management Planned:   Additional Equipment:   Intra-op Plan:   Post-operative Plan:   Informed Consent: I have reviewed the patients History and Physical, chart, labs and discussed the procedure including the risks, benefits and alternatives for the proposed anesthesia with the patient or authorized representative who has indicated his/her understanding and acceptance.       Plan Discussed with:   Anesthesia Plan Comments:         Anesthesia Quick Evaluation

## 2019-07-24 NOTE — Anesthesia Procedure Notes (Signed)
Epidural Patient location during procedure: OB  Staffing Anesthesiologist: Tashera Montalvo, MD Performed: anesthesiologist   Preanesthetic Checklist Completed: patient identified, IV checked, risks and benefits discussed, monitors and equipment checked, pre-op evaluation and timeout performed  Epidural Patient position: sitting Prep: DuraPrep and site prepped and draped Patient monitoring: heart rate, continuous pulse ox and blood pressure Approach: midline Location: L3-L4 Injection technique: LOR air and LOR saline  Needle:  Needle type: Tuohy  Needle gauge: 17 G Needle length: 9 cm Needle insertion depth: 8 cm Catheter type: closed end flexible Catheter size: 19 Gauge Catheter at skin depth: 14 cm Test dose: negative  Assessment Sensory level: T8 Events: blood not aspirated, injection not painful, no injection resistance, no paresthesia and negative IV test  Additional Notes Reason for block:procedure for pain     

## 2019-07-24 NOTE — Progress Notes (Addendum)
At [facility] for cervical balloon placement at MD request. IOL for [facility] on labetalol 200 mg TID, BMI > 60, AMA on Pitocin  S: Sitting in chair, pelvic girdle pain ongoing, reports since 5 months gestation, very uncomfortable to sit in bed.   O:  Patient Vitals for the past 24 hrs:  BP Temp Temp src Pulse Resp Height Weight  07/24/19 0832 (!) 154/74 -- -- (!) 59 -- -- --  07/24/19 0826 (!) 161/80 -- -- 62 -- -- --  07/24/19 0804 (!) 149/70 98.1 F (36.7 C) Oral 69 16 -- --  07/24/19 0800 -- -- -- -- 18 -- --  07/24/19 0725 129/61 -- -- 69 -- -- --  07/24/19 0449 (!) 121/56 -- -- 71 18 -- --  07/24/19 0336 (!) 125/52 -- -- 72 -- -- --  07/24/19 0221 (!) 113/51 -- -- 73 -- -- --  07/24/19 0047 -- -- -- -- -- 5\' 4"  (1.626 m) (!) 171.5 kg   FHR 130, min/mod variability, + accels, variable and intermittent lates Ctx q 3-4, palp mild SVE 2/20/-2, vtx, IBOW, 60 cc fluid filled CB placed w/o difficulty Traction to leg.  A/P at [redacted]w[redacted]d  AMA, CHTN, BMI > 60 IOL on Pitocin  FHT cat 2 GBS neg BP stable into mild range, one high range  - resume labetalol 200 mg TID CB placed for ripening Pain control discussed, may try nitrous Patient desires to avoid epidural/narcotics Dr. [redacted]w[redacted]d updated, to follow  Amado Nash, MSN, CNM 07/24/2019, 8:31 AM

## 2019-07-24 NOTE — Progress Notes (Signed)
Patient ID: Sandra Boone, female   DOB: March 17, 1986, 34 y.o.   MRN: [id number] G4P3 with h/o CHTN for IOL Pregnancy complicated by NL amnio and morbid obesity Ht 5\' 4"  (1.626 m)   Wt (!) 171.5 kg   LMP 10/31/2018   BMI 64.88 kg/m   FHR tracing initially with poor BTBV and intermittent late decels now after IVF markedly improved with BTBV 5-25 and occ 10x10 accels.  No recurrent decels noted.  Will continue to monitor and start Pitocin as tracing continues to improve.

## 2019-07-25 LAB — CBC
HCT: 35.8 % — ABNORMAL LOW (ref 36.0–46.0)
Hemoglobin: 11.5 g/dL — ABNORMAL LOW (ref 12.0–15.0)
MCH: 28.5 pg (ref 26.0–34.0)
MCHC: 32.1 g/dL (ref 30.0–36.0)
MCV: 88.8 fL (ref 80.0–100.0)
Platelets: 240 10*3/uL (ref 150–400)
RBC: 4.03 MIL/uL (ref 3.87–5.11)
RDW: 15.9 % — ABNORMAL HIGH (ref 11.5–15.5)
WBC: 10.4 10*3/uL (ref 4.0–10.5)
nRBC: 0 % (ref 0.0–0.2)

## 2019-07-25 NOTE — Progress Notes (Signed)
No c/o; normal lochia, pain controlled, voiding w/o difficulty Nursing; planning renal u/s of baby; no lightheadedness/dizziness   Patient Vitals for the past 24 hrs:  BP Temp Temp src Pulse Resp SpO2  07/25/19 0323 136/62 98.2 F (36.8 C) Oral 66 18 99 %  07/24/19 2330 (!) 128/56 98.4 F (36.9 C) Oral 73 18 97 %  07/24/19 2121 117/61 -- -- 92 -- --  07/24/19 1930 122/90 98.9 F (37.2 C) Oral -- 16 99 %  07/24/19 1830 (!) 141/56 98.6 F (37 C) Oral 68 16 99 %  07/24/19 1747 (!) 149/74 -- -- 81 -- --  07/24/19 1731 (!) 147/76 -- -- 97 16 --  07/24/19 1716 (!) 148/79 -- -- 94 18 --  07/24/19 1701 (!) 153/77 -- -- (!) 103 16 --  07/24/19 1649 (!) 146/105 -- -- 99 16 --  07/24/19 1602 137/77 -- -- 64 18 --  07/24/19 1531 140/89 -- -- 64 18 --  07/24/19 1514 112/73 -- -- (!) 116 16 --  07/24/19 1436 (!) 137/56 -- -- 71 -- --  07/24/19 1431 (!) 133/57 -- -- 71 18 --  07/24/19 1426 (!) 123/49 -- -- 72 16 --  07/24/19 1421 (!) 120/54 -- -- 72 -- --  07/24/19 1417 120/62 -- -- 77 -- --  07/24/19 1414 138/60 -- -- 77 -- --  07/24/19 1407 119/70 -- -- 75 -- --  07/24/19 1401 (!) 156/79 -- -- 68 16 --  07/24/19 1357 (!) 139/55 -- -- 71 -- --  07/24/19 1352 (!) 168/97 -- -- 75 -- --  07/24/19 1348 (!) 150/93 -- -- 77 -- --  07/24/19 1346 (!) 154/73 -- -- 74 -- --  07/24/19 1237 (!) 166/89 -- -- 69 -- --  07/24/19 1234 (!) 181/98 -- -- 71 -- --  07/24/19 1138 (!) 158/67 -- -- 65 -- --  07/24/19 1020 (!) 153/64 -- -- 68 18 --  07/24/19 1002 (!) 172/56 -- -- 61 -- --  07/24/19 0932 (!) 156/80 -- -- (!) 58 16 --  07/24/19 0902 (!) 142/68 -- -- 63 -- --  07/24/19 0832 (!) 154/74 -- -- (!) 59 -- --  07/24/19 0826 (!) 161/80 -- -- 62 -- --  07/24/19 0804 (!) 149/70 98.1 F (36.7 C) Oral 69 16 --  07/24/19 0800 -- -- -- -- 18 --  07/24/19 0725 129/61 -- -- 69 -- --   A&ox3 nml respirations Abd: soft, nt, obese LE:   CBC Latest Ref Rng & Units 07/25/2019 07/24/2019 07/24/2019  WBC 4.0 -  10.5 K/uL 10.4 11.8(H) 11.0(H)  Hemoglobin 12.0 - 15.0 g/dL 11.5(L) 12.8 12.8  Hematocrit 36.0 - 46.0 % 35.8(L) 40.0 39.5  Platelets 150 - 400 K/uL 240 292 301   A/P: ppd1 s/p svd 1. Doing well, contin care 2. chtn - labetalol 200mg  po tid 3. Hypothyroid-synthroid 4. ama 5. Female - abnml kidney, for peds to fu; pt would like circ if ok, but will hold plan as getting renal u/s; possible circ tomorrow or with urology pending eval 6. Ruptured baker's cyst - contin crutches to ambulate 7. Mild acute anemia, plan iron rich foods pp

## 2019-07-25 NOTE — Progress Notes (Signed)
Pt had an IV infusing Pitocin at 62.5 ml/hr that was continued from labor and delivery. RN notified CNM for an order to continue or to discontinue. CNM gave verbal order to continue and finish the 1 bag of Pitocin at 62.5 ml/hr due to delivery of large infant.

## 2019-07-25 NOTE — Lactation Note (Signed)
This note was copied from a baby's chart. Lactation Consultation Note  Patient Name: Sandra Boone [id number] Date: 07/25/2019 Reason for consult: Initial assessment;Early term 10-38.6wks  P4 mother whose infant is now 67 hours old.  This is an ETI at 38+0 weeks.  Mother breast fed her first child for a year and her second child for a couple of months.  Her youngest child is now 36 years old and mother feels like she is "starting over."    I reassured her that we will provide help with breast feeding as needed.  Reviewed breast feeding basics including feeding STS, tummy size, hand expression, feeding cues, how to obtain and maintain a good latch and how to awaken a sleepy baby.  Offered to assist with latching and mother agreeable.  Mother's breasts are large, soft and non tender and nipples are everted and intact.  Taught hand expression and mother was able to return demonstrate.  Colostrum drops fed to baby via my gloved finger.  Baby required suck training and performed for 5 minutes while visiting with mother.  Assisted to latch to the left breast in the cross cradle hold easily.  With constant stimulation he began to suck and mother felt a tug at her breast.  She denied pain with latching.  Demonstrated breast compressions and stimulation to encourage him to continue sucking.  Observed a wide gape and flanged lips.  Continued to stimulate him for his entire 15 minute feeding session.  Showed father how he could actively assist mother with feeding.  At the end of 15 minutes he tired and I placed him STS on mother's chest.  She will feed 8-12 times/24 hours or sooner if baby shows feeding cues.  Suggested mother awaken him by three hours if he does not self awaken due to gestational age.  Mother verbalized understanding.  Informed mother that she can call her RN/LC for latch assistance as needed.  Mother very appreciative of assistance given.  She has a DEBP for home use.  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  Father with no questions at this time.  RN updated.   Maternal Data Formula Feeding for Exclusion: No Has patient been taught Hand Expression?: Yes Does the patient have breastfeeding experience prior to this delivery?: Yes  Feeding Feeding Type: Breast Fed  LATCH Score Latch: Grasps breast easily, tongue down, lips flanged, rhythmical sucking.  Audible Swallowing: None  Type of Nipple: Everted at rest and after stimulation  Comfort (Breast/Nipple): Soft / non-tender  Hold (Positioning): Assistance needed to correctly position infant at breast and maintain latch.  LATCH Score: 7  Interventions Interventions: Breast feeding basics reviewed;Assisted with latch;Skin to skin;Breast massage;Hand express;Breast compression;Adjust position;Position options;Support pillows  Lactation Tools Discussed/Used WIC Program: No   Consult Status Consult Status: Follow-up Date: 07/26/19 Follow-up type: In-patient    Cobi Aldape R Danylah Holden 07/25/2019, 11:46 AM

## 2019-07-25 NOTE — Anesthesia Postprocedure Evaluation (Signed)
Anesthesia Post Note  Patient: Sandra Boone  Procedure(s) Performed: AN AD HOC LABOR EPIDURAL     Patient location during evaluation: Mother Baby Anesthesia Type: Epidural Level of consciousness: awake and alert Pain management: pain level controlled Vital Signs Assessment: post-procedure vital signs reviewed and stable Respiratory status: spontaneous breathing, nonlabored ventilation and respiratory function stable Cardiovascular status: stable Postop Assessment: no headache, no backache and epidural receding Anesthetic complications: no    Last Vitals:  Vitals:   07/25/19 0323 07/25/19 0732  BP: 136/62 135/62  Pulse: 66 62  Resp: 18 18  Temp: 36.8 C 36.7 C  SpO2: 99% 99%    Last Pain:  Vitals:   07/25/19 0732  TempSrc: Oral  PainSc:    Pain Goal:                   [name]

## 2019-07-26 LAB — SURGICAL PATHOLOGY

## 2019-07-26 NOTE — Discharge Instructions (Signed)
Per postpartum booklet given

## 2019-07-26 NOTE — Lactation Note (Signed)
This note was copied from a baby's chart. Lactation Consultation Note  Patient Name: Sandra Boone [id number] Date: 07/26/2019 Reason for consult: Follow-up assessment;Early term 37-38.6wks;Infant weight loss;Nipple pain/trauma Baby is 40 hours old  As LC entered the room baby latched, LC assessed and noted the baby's lips needed to flipped  To flanged and LC showed mom and eased chin to increase depth. Increased swallows noted.  Mom mentioned the right nipple is tender.  LC recommended after breast massage, hand express, reverse pressure.  LC provided the hand pump and #24 F and #27 F if needed.  Sore nipple and engorgement prevention and tx reviewed .  Mom has the [facility] pamphlet and LC made her aware of the virtual support group.  Per mom has Spectra 2 DEBP at home.   Maternal Data    Feeding - Latch score by the [facility] )  Feeding Type: (baby already latched with swallows)  LATCH Score Latch: (latched with depth)  Audible Swallowing: (swallows noted)  Type of Nipple: Everted at rest and after stimulation  Comfort (Breast/Nipple): (per mom comfortable)  Hold (Positioning): (LC flipped upper lip to flanged position)  LATCH Score: 9  Interventions Interventions: Breast feeding basics reviewed  Lactation Tools Discussed/Used Tools: Shells   Consult Status Consult Status: Complete Date: 07/26/19    Kathrin Greathouse 07/26/2019, 9:15 AM

## 2019-07-26 NOTE — Discharge Summary (Signed)
OB Discharge Summary     Patient Name: Sandra Boone DOB: September 28, 1985 MRN: [id number]  Date of admission: 07/24/2019 Delivering MD: Rhoderick Moody E   Date of discharge: 07/26/2019  Admitting diagnosis: Encounter for induction of labor [Z34.90] Intrauterine pregnancy: [redacted]w[redacted]d     Secondary diagnosis:  Active Problems:   Encounter for induction of labor  Additional problems: hypothyroidism Morbid obesity     Discharge diagnosis: Term Pregnancy Delivered, CHTN and hypothyroidism, morbid obesity, AMA                                                                                                Post partum procedures:none  Augmentation: Pitocin  Complications: None  Hospital course:  Induction of Labor With Vaginal Delivery   34 y.o. yo [id number] at [redacted]w[redacted]d was admitted to the hospital 07/24/2019 for induction of labor.  Indication for induction: chronic HTN.  Patient had an uncomplicated labor course as follows: Membrane Rupture Time/Date: 2:45 PM ,07/24/2019   Intrapartum Procedures: Episiotomy: None [1]                                         Lacerations:  None [1]  Patient had delivery of a Viable infant.  Information for the patient's newborn:  Areana, Kosanke [[id number]]  Delivery Method: Vag-Spont    07/24/2019  Details of delivery can be found in separate delivery note.  Patient had a routine postpartum course. Patient is discharged home 07/26/19.  Physical exam  Vitals:   07/25/19 1605 07/25/19 1710 07/25/19 2222 07/26/19 0518  BP: 138/70 138/71 (!) 141/87 140/72  Pulse: 83 78 80 65  Resp:   18 18  Temp:   98.6 F (37 C) 98.6 F (37 C)  TempSrc:   Oral   SpO2: 97% 98% 98% 98%  Weight:      Height:       General: alert, cooperative and no distress Lochia: appropriate Uterine Fundus: firm Incision: Dressing is clean, dry, and intact DVT Evaluation: No evidence of DVT seen on physical exam. Labs: Lab Results  Component Value Date   WBC 10.4 07/25/2019   HGB 11.5 (L) 07/25/2019   HCT 35.8 (L) 07/25/2019   MCV 88.8 07/25/2019   PLT 240 07/25/2019   CMP Latest Ref Rng & Units 07/24/2019  Glucose 70 - 99 mg/dL 97  BUN 6 - 20 mg/dL 16  Creatinine 3.61 - 4.43 mg/dL 1.54  Sodium 008 - 676 mmol/L 138  Potassium 3.5 - 5.1 mmol/L 4.0  Chloride 98 - 111 mmol/L 106  CO2 22 - 32 mmol/L 21(L)  Calcium 8.9 - 10.3 mg/dL 8.9  Total Protein 6.5 - 8.1 g/dL 6.0(L)  Total Bilirubin 0.3 - 1.2 mg/dL 0.5  Alkaline Phos 38 - 126 U/L 179(H)  AST 15 - 41 U/L 21  ALT 0 - 44 U/L 20    Discharge instruction: per After Visit Summary and "Baby and Me Booklet".  After visit meds:    Diet: low salt diet  Activity: Advance as tolerated. Pelvic rest for 6 weeks.   Outpatient follow up:1wk Follow up Appt:No future appointments. Follow up Visit:No follow-ups on file.  Postpartum contraception: Not Discussed  Newborn Data: Live born female  Birth Weight: 6 lb 14.2 oz (3124 g) APGAR: 9, 9  Newborn Delivery   Birth date/time: 07/24/2019 16:31:00 Delivery type: Vaginal, Spontaneous      Baby Feeding: Breast Disposition:home with mother   07/26/2019 Marvene Staff, MD

## 2019-07-27 ENCOUNTER — Ambulatory Visit: Payer: Self-pay

## 2019-07-27 NOTE — Lactation Note (Signed)
This note was copied from a baby's chart. Lactation Consultation Note  Patient Name: Sandra Boone [id number] Date: 07/27/2019 Reason for consult: Follow-up assessment;Early term 37-38.6wks;Infant weight loss;Other (Comment)(7 % weight loss - per mom milk is coming in)  Baby is 56 hours old , per mom breast are filling fuller and my milk are  changing.  LC reviewed and updated doc flow sheets and added voids and stools not documented at night  Per mom and this am .  Per mom has a DEBP at home the hand pump from the hospital.  Mom remembers the [facility] teaching from yesterday to prevent engorgement.  Mom awaiting on test results for baby.    Maternal Data    Feeding Feeding Type: (per mom baby recently fed 26 minutes at 1030 am)  LATCH Score                   Interventions Interventions: Breast feeding basics reviewed  Lactation Tools Discussed/Used Tools: Pump Breast pump type: Manual   Consult Status Consult Status: Complete Date: 07/27/19    Matilde Sprang Jayliani Wanner 07/27/2019, 12:09 PM

## 2019-10-14 NOTE — Pre-Procedure Instructions (Signed)
Sandra Boone  10/14/2019    Your procedure is scheduled on Thursday, October 17, 2019 at 7:30 AM.   Report to [facility] Entrance "A" Admitting Office at 5:30 AM.   Call this number if you have problems the morning of surgery: [phone]   Questions prior to day of surgery, please call [phone] between 8 & 4 PM.   Remember:  Do not eat food after midnight Wednesday, 10/16/19.  You may drink clear liquids until 4:30 AM.  Clear liquids allowed are:  Water, Juice (non-citric and without pulp - diabetics please choose diet or no sugar options), Carbonated beverages - (diabetics please choose diet or no sugar options), Clear Tea, Black Coffee only (no creamer, milk or cream including half and half) and Gatorade (diabetics please choose diet or no sugar options)   Drink the Pre-Surgery Ensure between 4:15 AM and 4:30 AM morning of surgery. This will be the liquid you will drink.    Take these medicines the morning of surgery with A SIP OF WATER: Labetalol (Normodyne), Levothyroxine (Synthroid), Acetaminophen (Tylenol) - if needed, Flonase nasal spray - if needed.  Stop Vitamins as of today prior to surgery. Do not use NSAIDS (Ibuprofen, Aleve, etc), Aspirin containing products, Herbal medications or Fish Oil prior to surgery.    Do not wear jewelry, make-up or nail polish.  Do not wear lotions, powders, perfumes or deodorant.  Do not shave 48 hours prior to surgery.    Do not bring valuables to the hospital.  [facility] is not responsible for any belongings or valuables.  Contacts, dentures or bridgework may not be worn into surgery.  Leave your suitcase in the car.  After surgery it may be brought to your room.  For patients admitted to the hospital, discharge time will be determined by your treatment team.  Patients discharged the day of surgery will not be allowed to drive home.   [place] - Preparing for Surgery  Before surgery, you can play an important role.   Because skin is not sterile, your skin needs to be as free of germs as possible.  You can reduce the number of germs on you skin by washing with CHG (chlorahexidine gluconate) soap before surgery.  CHG is an antiseptic cleaner which kills germs and bonds with the skin to continue killing germs even after washing.  Oral Hygiene is also important in reducing the risk of infection.  Remember to brush your teeth with your regular toothpaste the morning of surgery.  Please DO NOT use if you have an allergy to CHG or antibacterial soaps.  If your skin becomes reddened/irritated stop using the CHG and inform your nurse when you arrive at Short Stay.  Do not shave (including legs and underarms) for at least 48 hours prior to the first CHG shower.  You may shave your face.  Please follow these instructions carefully:   1.  Shower with CHG Soap the night before surgery and the morning of Surgery.  2.  If you choose to wash your hair, wash your hair first as usual with your normal shampoo.  3.  After you shampoo, rinse your hair and body thoroughly to remove the shampoo. 4.  Use CHG as you would any other liquid soap.  You can apply chg directly to the skin and wash gently with a      scrungie or washcloth.           5.  Apply the CHG Soap  to your body ONLY FROM THE NECK DOWN.   Do not use on open wounds or open sores. Avoid contact with your eyes, ears, mouth and genitals (private parts).  Wash genitals (private parts) with your normal soap - do this prior to using CHG soap.  6.  Wash thoroughly, paying special attention to the area where your surgery will be performed.  7.  Thoroughly rinse your body with warm water from the neck down.  8.  DO NOT shower/wash with your normal soap after using and rinsing off the CHG Soap.  9.  Pat yourself dry with a clean towel.            10.  Wear clean pajamas.            11.  Place clean sheets on your bed the night of your first shower and do not sleep with  pets.  Day of Surgery  Shower as above. Do not apply any lotions/deodorants the morning of surgery.   Please wear clean clothes to the hospital. Remember to brush your teeth with toothpaste.  Please read over the fact sheets that you were given.

## 2019-10-15 ENCOUNTER — Other Ambulatory Visit: Payer: Self-pay

## 2019-10-15 ENCOUNTER — Encounter (HOSPITAL_COMMUNITY): Payer: Self-pay

## 2019-10-15 ENCOUNTER — Other Ambulatory Visit (HOSPITAL_COMMUNITY)
Admission: RE | Admit: 2019-10-15 | Discharge: 2019-10-15 | Disposition: A | Discharge status: Home / Self Care | Payer: BC Managed Care – PPO | Source: Ambulatory Visit | Attending: Orthopedic Surgery | Admitting: Orthopedic Surgery

## 2019-10-15 ENCOUNTER — Encounter (HOSPITAL_COMMUNITY)
Admission: RE | Admit: 2019-10-15 | Discharge: 2019-10-15 | Disposition: A | Discharge status: Home / Self Care | Payer: BC Managed Care – PPO | Source: Ambulatory Visit | Attending: Orthopedic Surgery | Admitting: Orthopedic Surgery

## 2019-10-15 DIAGNOSIS — Z01818 Encounter for other preprocedural examination: Secondary | ICD-10-CM | POA: Insufficient documentation

## 2019-10-15 DIAGNOSIS — Z20822 Contact with and (suspected) exposure to covid-19: Secondary | ICD-10-CM | POA: Insufficient documentation

## 2019-10-15 LAB — BASIC METABOLIC PANEL
Anion gap: 9 (ref 5–15)
BUN: 16 mg/dL (ref 6–20)
CO2: 26 mmol/L (ref 22–32)
Calcium: 9.2 mg/dL (ref 8.9–10.3)
Chloride: 104 mmol/L (ref 98–111)
Creatinine, Ser: 0.78 mg/dL (ref 0.44–1.00)
GFR calc Af Amer: >60 mL/min (ref >60)
GFR calc non Af Amer: >60 mL/min (ref >60)
Glucose, Bld: 106 mg/dL — ABNORMAL HIGH (ref 70–99)
Potassium: 3.7 mmol/L (ref 3.5–5.1)
Sodium: 139 mmol/L (ref 135–145)

## 2019-10-15 LAB — CBC
HCT: 41.6 % (ref 36.0–46.0)
Hemoglobin: 13 g/dL (ref 12.0–15.0)
MCH: 28.1 pg (ref 26.0–34.0)
MCHC: 31.3 g/dL (ref 30.0–36.0)
MCV: 90 fL (ref 80.0–100.0)
Platelets: 323 10*3/uL (ref 150–400)
RBC: 4.62 MIL/uL (ref 3.87–5.11)
RDW: 13.6 % (ref 11.5–15.5)
WBC: 7.4 10*3/uL (ref 4.0–10.5)
nRBC: 0 % (ref 0.0–0.2)

## 2019-10-15 LAB — SARS CORONAVIRUS 2 (TAT 6-24 HRS): SARS Coronavirus 2: NEGATIVE

## 2019-10-15 NOTE — Progress Notes (Addendum)
PCP - Dr. Peri Maris Cardiologist - Denies  Chest x-ray - Not indicated EKG - 10/15/19 Stress Test - Per patient non-stress test 2021 ECHO - Denies Cardiac Cath - Denies  Sleep Study - Denies  DM - Denies  ERAS Protcol - Instructions given PRE-SURGERY Ensure given   COVID TEST- 10/15/19  Anesthesia review: yes abn ecg  Patient denies shortness of breath, fever, cough and chest pain at PAT appointment   All instructions explained to the patient, with a verbal understanding of the material. Patient agrees to go over the instructions while at home for a better understanding. Patient also instructed to self quarantine after being tested for COVID-19. The opportunity to ask questions was provided.

## 2019-10-16 MED ORDER — DEXTROSE 5 % IV SOLN
3.0000 g | INTRAVENOUS | Status: AC
  Administered 2019-10-17: 3 g via INTRAVENOUS
  Filled 2019-10-16: qty 3

## 2019-10-16 NOTE — Anesthesia Preprocedure Evaluation (Addendum)
Anesthesia Evaluation  Patient identified by MRN, date of birth, ID band Patient awake    Reviewed: Allergy & Precautions, NPO status , Patient's Chart, lab work & pertinent test results  History of Anesthesia Complications Negative for: history of anesthetic complications  Airway Mallampati: II  TM Distance: >3 FB Neck ROM: Full    Dental  (+) Dental Advisory Given   Pulmonary former smoker,    Pulmonary exam normal        Cardiovascular hypertension, Pt. on medications and Pt. on home beta blockers Normal cardiovascular exam     Neuro/Psych  Headaches, PSYCHIATRIC DISORDERS Depression    GI/Hepatic negative GI ROS, Neg liver ROS,   Endo/Other  Hypothyroidism Morbid obesity  Renal/GU negative Renal ROS     Musculoskeletal negative musculoskeletal ROS (+)   Abdominal (+) + obese,   Peds  Hematology negative hematology ROS (+)   Anesthesia Other Findings Covid test negative   Reproductive/Obstetrics                           Anesthesia Physical Anesthesia Plan  ASA: III  Anesthesia Plan: General   Post-op Pain Management:    Induction: Intravenous  PONV Risk Score and Plan: 4 or greater and Treatment may vary due to age or medical condition, Ondansetron, Midazolam and Dexamethasone  Airway Management Planned: Oral ETT  Additional Equipment: None  Intra-op Plan:   Post-operative Plan: Extubation in OR  Informed Consent: I have reviewed the patients History and Physical, chart, labs and discussed the procedure including the risks, benefits and alternatives for the proposed anesthesia with the patient or authorized representative who has indicated his/her understanding and acceptance.     Dental advisory given  Plan Discussed with: CRNA and Anesthesiologist  Anesthesia Plan Comments:        Anesthesia Quick Evaluation

## 2019-10-17 ENCOUNTER — Ambulatory Visit (HOSPITAL_COMMUNITY): Payer: BC Managed Care – PPO | Admitting: Physician Assistant

## 2019-10-17 ENCOUNTER — Ambulatory Visit (HOSPITAL_COMMUNITY): Payer: BC Managed Care – PPO

## 2019-10-17 ENCOUNTER — Ambulatory Visit (HOSPITAL_COMMUNITY): Payer: BC Managed Care – PPO | Admitting: Anesthesiology

## 2019-10-17 ENCOUNTER — Other Ambulatory Visit: Payer: Self-pay

## 2019-10-17 ENCOUNTER — Encounter (HOSPITAL_COMMUNITY): Payer: Self-pay | Admitting: Orthopedic Surgery

## 2019-10-17 ENCOUNTER — Encounter (HOSPITAL_COMMUNITY)
Admission: RE | Disposition: A | Discharge status: Home / Self Care | Payer: Self-pay | Source: Home / Self Care | Attending: Orthopedic Surgery

## 2019-10-17 ENCOUNTER — Ambulatory Visit (HOSPITAL_COMMUNITY)
Admission: RE | Admit: 2019-10-17 | Discharge: 2019-10-17 | Disposition: A | Discharge status: Home / Self Care | Payer: BC Managed Care – PPO | Attending: Orthopedic Surgery | Admitting: Orthopedic Surgery

## 2019-10-17 DIAGNOSIS — Z87891 Personal history of nicotine dependence: Secondary | ICD-10-CM | POA: Insufficient documentation

## 2019-10-17 DIAGNOSIS — Z79899 Other long term (current) drug therapy: Secondary | ICD-10-CM | POA: Insufficient documentation

## 2019-10-17 DIAGNOSIS — S82142A Displaced bicondylar fracture of left tibia, initial encounter for closed fracture: Secondary | ICD-10-CM | POA: Diagnosis not present

## 2019-10-17 DIAGNOSIS — X58XXXA Exposure to other specified factors, initial encounter: Secondary | ICD-10-CM | POA: Diagnosis not present

## 2019-10-17 DIAGNOSIS — I10 Essential (primary) hypertension: Secondary | ICD-10-CM | POA: Insufficient documentation

## 2019-10-17 DIAGNOSIS — M2242 Chondromalacia patellae, left knee: Secondary | ICD-10-CM | POA: Insufficient documentation

## 2019-10-17 DIAGNOSIS — Z79890 Hormone replacement therapy (postmenopausal): Secondary | ICD-10-CM | POA: Insufficient documentation

## 2019-10-17 DIAGNOSIS — E039 Hypothyroidism, unspecified: Secondary | ICD-10-CM | POA: Insufficient documentation

## 2019-10-17 DIAGNOSIS — Z419 Encounter for procedure for purposes other than remedying health state, unspecified: Secondary | ICD-10-CM

## 2019-10-17 DIAGNOSIS — Z6841 Body Mass Index (BMI) 40.0 and over, adult: Secondary | ICD-10-CM | POA: Insufficient documentation

## 2019-10-17 DIAGNOSIS — S83242A Other tear of medial meniscus, current injury, left knee, initial encounter: Secondary | ICD-10-CM | POA: Diagnosis not present

## 2019-10-17 HISTORY — PX: KNEE ARTHROSCOPY WITH SUBCHONDROPLASTY: SHX6732

## 2019-10-17 LAB — POCT PREGNANCY, URINE: Preg Test, Ur: NEGATIVE

## 2019-10-17 SURGERY — ARTHROSCOPY, KNEE, WITH SUBCHONDROPLASTY
Anesthesia: General | Site: Knee | Laterality: Left

## 2019-10-17 MED ORDER — LIDOCAINE 2% (20 MG/ML) 5 ML SYRINGE
INTRAMUSCULAR | Status: DC | PRN
  Administered 2019-10-17: 80 mg via INTRAVENOUS

## 2019-10-17 MED ORDER — BUPIVACAINE-EPINEPHRINE 0.5% -1:200000 IJ SOLN
INTRAMUSCULAR | Status: AC
  Filled 2019-10-17: qty 1

## 2019-10-17 MED ORDER — AMISULPRIDE (ANTIEMETIC) 5 MG/2ML IV SOLN
10.0000 mg | Freq: Once | INTRAVENOUS | Status: AC
  Administered 2019-10-17: 10 mg via INTRAVENOUS

## 2019-10-17 MED ORDER — FENTANYL CITRATE (PF) 100 MCG/2ML IJ SOLN
25.0000 ug | INTRAMUSCULAR | Status: DC | PRN
  Administered 2019-10-17: 50 ug via INTRAVENOUS

## 2019-10-17 MED ORDER — SUCCINYLCHOLINE CHLORIDE 200 MG/10ML IV SOSY
PREFILLED_SYRINGE | INTRAVENOUS | Status: DC | PRN
Start: 2019-10-17 — End: 2019-10-17
  Administered 2019-10-17: 140 mg via INTRAVENOUS

## 2019-10-17 MED ORDER — BUPIVACAINE-EPINEPHRINE 0.5% -1:200000 IJ SOLN
INTRAMUSCULAR | Status: DC | PRN
  Administered 2019-10-17: 30 mL

## 2019-10-17 MED ORDER — AMISULPRIDE (ANTIEMETIC) 5 MG/2ML IV SOLN
INTRAVENOUS | Status: AC
  Filled 2019-10-17: qty 4

## 2019-10-17 MED ORDER — PHENYLEPHRINE 40 MCG/ML (10ML) SYRINGE FOR IV PUSH (FOR BLOOD PRESSURE SUPPORT)
PREFILLED_SYRINGE | INTRAVENOUS | Status: AC
  Filled 2019-10-17: qty 10

## 2019-10-17 MED ORDER — SUCCINYLCHOLINE CHLORIDE 200 MG/10ML IV SOSY
PREFILLED_SYRINGE | INTRAVENOUS | Status: AC
  Filled 2019-10-17: qty 10

## 2019-10-17 MED ORDER — ROCURONIUM BROMIDE 10 MG/ML (PF) SYRINGE
PREFILLED_SYRINGE | INTRAVENOUS | Status: DC | PRN
  Administered 2019-10-17: 4 mg via INTRAVENOUS

## 2019-10-17 MED ORDER — MIDAZOLAM HCL 2 MG/2ML IJ SOLN
INTRAMUSCULAR | Status: AC
  Filled 2019-10-17: qty 2

## 2019-10-17 MED ORDER — CHLORHEXIDINE GLUCONATE 0.12 % MT SOLN
15.0000 mL | Freq: Once | OROMUCOSAL | Status: AC
  Administered 2019-10-17: 15 mL via OROMUCOSAL
  Filled 2019-10-17: qty 15

## 2019-10-17 MED ORDER — ORAL CARE MOUTH RINSE: 15.0000 mL | Freq: Once | OROMUCOSAL | Status: AC

## 2019-10-17 MED ORDER — EPHEDRINE 5 MG/ML INJ
INTRAVENOUS | Status: AC
  Filled 2019-10-17: qty 10

## 2019-10-17 MED ORDER — PROMETHAZINE HCL 25 MG/ML IJ SOLN: 6.2500 mg | INTRAMUSCULAR | Status: DC | PRN

## 2019-10-17 MED ORDER — OXYCODONE HCL 5 MG/5ML PO SOLN: 5.0000 mg | Freq: Once | ORAL | Status: AC | PRN

## 2019-10-17 MED ORDER — PROPOFOL 10 MG/ML IV BOLUS
INTRAVENOUS | Status: AC
  Filled 2019-10-17: qty 20

## 2019-10-17 MED ORDER — LACTATED RINGERS IV SOLN: INTRAVENOUS | Status: DC

## 2019-10-17 MED ORDER — GLYCOPYRROLATE PF 0.2 MG/ML IJ SOSY
PREFILLED_SYRINGE | INTRAMUSCULAR | Status: DC | PRN
Start: 2019-10-17 — End: 2019-10-17
  Administered 2019-10-17: .1 mg via INTRAVENOUS

## 2019-10-17 MED ORDER — ONDANSETRON HCL 4 MG/2ML IJ SOLN
INTRAMUSCULAR | Status: AC
  Filled 2019-10-17: qty 2

## 2019-10-17 MED ORDER — OXYCODONE HCL 5 MG PO TABS: 5.0000 mg | ORAL_TABLET | ORAL | 0 refills | Status: AC | PRN

## 2019-10-17 MED ORDER — LIDOCAINE 2% (20 MG/ML) 5 ML SYRINGE
INTRAMUSCULAR | Status: AC
  Filled 2019-10-17: qty 5

## 2019-10-17 MED ORDER — FENTANYL CITRATE (PF) 250 MCG/5ML IJ SOLN
INTRAMUSCULAR | Status: DC | PRN
  Administered 2019-10-17: 50 ug via INTRAVENOUS
  Administered 2019-10-17: 100 ug via INTRAVENOUS
  Administered 2019-10-17 (×2): 50 ug via INTRAVENOUS

## 2019-10-17 MED ORDER — FENTANYL CITRATE (PF) 250 MCG/5ML IJ SOLN
INTRAMUSCULAR | Status: AC
  Filled 2019-10-17: qty 5

## 2019-10-17 MED ORDER — DEXAMETHASONE SODIUM PHOSPHATE 10 MG/ML IJ SOLN
INTRAMUSCULAR | Status: DC | PRN
  Administered 2019-10-17: 4 mg via INTRAVENOUS

## 2019-10-17 MED ORDER — DEXAMETHASONE SODIUM PHOSPHATE 10 MG/ML IJ SOLN
INTRAMUSCULAR | Status: AC
  Filled 2019-10-17: qty 1

## 2019-10-17 MED ORDER — SODIUM CHLORIDE 0.9 % IR SOLN
Status: DC | PRN
  Administered 2019-10-17: 3000 mL

## 2019-10-17 MED ORDER — MIDAZOLAM HCL 5 MG/5ML IJ SOLN
INTRAMUSCULAR | Status: DC | PRN
  Administered 2019-10-17: 2 mg via INTRAVENOUS

## 2019-10-17 MED ORDER — OXYCODONE HCL 5 MG PO TABS
ORAL_TABLET | ORAL | Status: AC
  Filled 2019-10-17: qty 1

## 2019-10-17 MED ORDER — ONDANSETRON 4 MG PO TBDP: 4.0000 mg | ORAL_TABLET | Freq: Three times a day (TID) | ORAL | 0 refills | Status: AC | PRN

## 2019-10-17 MED ORDER — FENTANYL CITRATE (PF) 100 MCG/2ML IJ SOLN
INTRAMUSCULAR | Status: AC
  Filled 2019-10-17: qty 2

## 2019-10-17 MED ORDER — ONDANSETRON HCL 4 MG/2ML IJ SOLN
INTRAMUSCULAR | Status: DC | PRN
  Administered 2019-10-17: 4 mg via INTRAVENOUS

## 2019-10-17 MED ORDER — ACETAMINOPHEN 10 MG/ML IV SOLN
INTRAVENOUS | Status: DC | PRN
  Administered 2019-10-17: 1000 mg via INTRAVENOUS

## 2019-10-17 MED ORDER — ACETAMINOPHEN 10 MG/ML IV SOLN
INTRAVENOUS | Status: AC
  Filled 2019-10-17: qty 100

## 2019-10-17 MED ORDER — TRANEXAMIC ACID-NACL 1000-0.7 MG/100ML-% IV SOLN
1000.0000 mg | INTRAVENOUS | Status: AC
  Administered 2019-10-17: 1000 mg via INTRAVENOUS
  Filled 2019-10-17: qty 100

## 2019-10-17 MED ORDER — OXYCODONE HCL 5 MG PO TABS
5.0000 mg | ORAL_TABLET | Freq: Once | ORAL | Status: AC | PRN
  Administered 2019-10-17: 5 mg via ORAL

## 2019-10-17 MED ORDER — ENOXAPARIN SODIUM 40 MG/0.4ML ~~LOC~~ SOLN: 40.0000 mg | SUBCUTANEOUS | 0 refills | Status: AC

## 2019-10-17 MED ORDER — SUGAMMADEX SODIUM 200 MG/2ML IV SOLN
INTRAVENOUS | Status: DC | PRN
  Administered 2019-10-17: 320 mg via INTRAVENOUS

## 2019-10-17 MED ORDER — PROPOFOL 10 MG/ML IV BOLUS
INTRAVENOUS | Status: DC | PRN
  Administered 2019-10-17: 200 mg via INTRAVENOUS

## 2019-10-17 MED ORDER — EPHEDRINE SULFATE-NACL 50-0.9 MG/10ML-% IV SOSY
PREFILLED_SYRINGE | INTRAVENOUS | Status: DC | PRN
  Administered 2019-10-17 (×5): 10 mg via INTRAVENOUS

## 2019-10-17 SURGICAL SUPPLY — 33 items
BLADE CUTTER GATOR 3.5 (BLADE) ×3 IMPLANT
BNDG CMPR MED 10X6 ELC LF (GAUZE/BANDAGES/DRESSINGS) ×1
BNDG ELASTIC 6X10 VLCR STRL LF (GAUZE/BANDAGES/DRESSINGS) ×2 IMPLANT
BNDG ELASTIC 6X5.8 VLCR STR LF (GAUZE/BANDAGES/DRESSINGS) ×3 IMPLANT
COVER SURGICAL LIGHT HANDLE (MISCELLANEOUS) ×3 IMPLANT
COVER WAND RF STERILE (DRAPES) ×3 IMPLANT
DRAPE U-SHAPE 47X51 STRL (DRAPES) ×3 IMPLANT
DRSG PAD ABDOMINAL 8X10 ST (GAUZE/BANDAGES/DRESSINGS) ×3 IMPLANT
DRSG XEROFORM 1X8 (GAUZE/BANDAGES/DRESSINGS) ×2 IMPLANT
DURAPREP 26ML APPLICATOR (WOUND CARE) ×3 IMPLANT
GAUZE 4X4 16PLY RFD (DISPOSABLE) ×3 IMPLANT
GAUZE SPONGE 4X4 12PLY STRL LF (GAUZE/BANDAGES/DRESSINGS) ×3 IMPLANT
GAUZE SPONGE 4X4 16PLY XRAY LF (GAUZE/BANDAGES/DRESSINGS) ×2 IMPLANT
GAUZE XEROFORM 1X8 LF (GAUZE/BANDAGES/DRESSINGS) ×3 IMPLANT
GLOVE BIO SURGEON STRL SZ7.5 (GLOVE) ×3 IMPLANT
GLOVE BIOGEL PI IND STRL 8 (GLOVE) ×1 IMPLANT
GLOVE BIOGEL PI INDICATOR 8 (GLOVE) ×2
GOWN STRL REUS W/ TWL LRG LVL3 (GOWN DISPOSABLE) ×2 IMPLANT
GOWN STRL REUS W/TWL LRG LVL3 (GOWN DISPOSABLE) ×6
GRAFT FILLER BONE 5ML (Knees) IMPLANT
KIT ACCUFILL 5CC (Knees) ×1 IMPLANT
KIT BASIN OR (CUSTOM PROCEDURE TRAY) ×3 IMPLANT
KIT KNEE SCP 414.502 (Knees) ×3 IMPLANT
MANIFOLD NEPTUNE II (INSTRUMENTS) ×3 IMPLANT
PACK ARTHROSCOPY DSU (CUSTOM PROCEDURE TRAY) ×3 IMPLANT
PADDING CAST ABS 6INX4YD NS (CAST SUPPLIES) ×2
PADDING CAST ABS COTTON 6X4 NS (CAST SUPPLIES) IMPLANT
PADDING CAST COTTON 6X4 STRL (CAST SUPPLIES) ×3 IMPLANT
SUT ETHILON 4 0 PS 2 18 (SUTURE) ×3 IMPLANT
SYR 30ML LL (SYRINGE) ×3 IMPLANT
TOWEL GREEN STERILE (TOWEL DISPOSABLE) ×3 IMPLANT
TUBING ARTHROSCOPY IRRIG 16FT (MISCELLANEOUS) ×3 IMPLANT
WRAP KNEE MAXI GEL POST OP (GAUZE/BANDAGES/DRESSINGS) ×3 IMPLANT

## 2019-10-17 NOTE — Op Note (Signed)
Surgeon(s): Yolonda Kida, MD   ANESTHESIA:  general, and regional   FLUIDS: Per anesthesia record.    ESTIMATED BLOOD LOSS: minimal     PREOPERATIVE DIAGNOSES:  1. Left knee medial meniscus tear 2.  Left medial tibial plateau insufficiency fracture 3.   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 Left knee arthroscopically aided treatment of medial tibia plateau insufficiency fracture with percutaneous internal fixation  2. Left knee arthroscopy with arthroscopic partial medial meniscectomy 3.  Left knee Chondroplasty,  medial femoral condyle, medial plateau, and posterior patella   Implant: Flowable calcium phosphate, 5 mL. Zimmer   DESCRIPTION OF PROCEDURE: The patient has a Left knee medial meniscus tear. They have had pain that has been refractory to conservative management. Their preoperative MRI demonstrated subchondral bone marrow edema and insufficiency fractures of the posterior- medial tibial plateau as well as the medial meniscus tear. Plans are to proceed with partial medial meniscectomy, internal fixation of subchondral insufficiency fractures with flowable calcium phosphate, and diagnostic arthroscopy with debridement as indicated. Full discussion held regarding risks benefits alternatives and complications related surgical intervention. Conservative care options reviewed. All questions answered.   The patient was identified in the preoperative holding area and the operative extremity was marked. The patient was brought to the operating room and transferred to operating table in a supine position. Satisfactory general anesthesia was induced by anesthesiology.     Standard anterolateral, anteromedial arthroscopy portals were obtained. The anteromedial portal was obtained with a spinal needle for localization under direct visualization with subsequent  diagnostic findings.    Anteromedial and anterolateral chambers: moderate synovitis. The synovitis was debrided with a 4.5 mm full radius shaver through both the anteromedial and lateral portals.    Suprapatellar pouch and gutters: mild synovitis or debris. Patella chondral surface: Grade 2 Trochlear chondral surface: Grade 1 Patellofemoral tracking: level Medial meniscus: parrot beak tear of the posterior meniscus with radial tear adjacent to the posterior root Medial femoral condyle flexion bearing surface: Grade 3 Medial femoral condyle extension bearing surface: Grade 2 Medial tibial plateau: Grade 2 Anterior cruciate ligament:stable Posterior cruciate ligament:stable Lateral meniscus: no tear.   Lateral femoral condyle flexion bearing surface: Grade 0 Lateral femoral condyle extension bearing surface: Grade 0 Lateral tibial plateau: Grade 2   Medial meniscus tear was debrided using biters and motorized shaver alternating until a stable remnant was left. Upon completion the probe was used to evaluate and assess the remaining meniscus which was gleaned to be stable.   Chondroplasty was achieved on above mentioned surfaces using a motorized shaver to debride the grade 3 unstable cartilage. Completion of the chondroplasty left stable surface. There was no full-thickness component noted.   Next we turned our attention to the internal fixation of the medial tibial plateau. Arthroscopically we evaluated medial tibia condyle noted there was no loose cartilage or debris surrounding the lesion and the fracture did not propagate to the joint surface. Using preoperative MRI we targeted the delivery device to just under the subchondral density and in the lesion. This was achieved with intraoperative fluoroscopy. Once accurate placement was noted on 2 views and confirmed we delivered 3 mL of flowable calcium phosphate into the medial tibial plateau lesion. We left the cannulas in place for  approximately 8 minutes while the implant hardened. We removed the cannulas and again took 2 views of fluoroscopic pictures to  confirm there was no extravasation outside of the bone. There was none noted.   After completion of synovectomy, diagnostic exam, and debridements as described, all compartments were checked and no residual debris remained. Hemostasis was achieved with the cautery wand. The portals were approximated with nylon suture. All excess fluid was expressed from the joint.  Xeroform sterile gauze dressings were applied followed by Ace bandage and ice pack.    There were no immediate competitions and all counts were correct.   DISPOSITION: The patient was awakened from general anesthetic, extubated, taken to the recovery room in medically stable condition, no apparent complications. The patient may be weightbearing as tolerated to the operative lower extremity with crutches.  Range of motion of right knee as tolerated.  They will use bid asa for DVT ppx, and return in 2 weeks for suture removal.   Yolonda Kida

## 2019-10-17 NOTE — Anesthesia Procedure Notes (Signed)
Procedure Name: Intubation Date/Time: 10/17/2019 7:40 AM Performed by: Adria Dill, CRNA Pre-anesthesia Checklist: Patient identified, Emergency Drugs available, Suction available and Patient being monitored Patient Re-evaluated:Patient Re-evaluated prior to induction Oxygen Delivery Method: Circle system utilized Preoxygenation: Pre-oxygenation with 100% oxygen Induction Type: IV induction Ventilation: Mask ventilation without difficulty Laryngoscope Size: Miller and 2 Grade View: Grade I Tube type: Oral Tube size: 7.0 mm Number of attempts: 1 Airway Equipment and Method: Stylet and Oral airway Placement Confirmation: ETT inserted through vocal cords under direct vision,  positive ETCO2 and breath sounds checked- equal and bilateral Secured at: 21 cm Tube secured with: Tape Dental Injury: Teeth and Oropharynx as per pre-operative assessment

## 2019-10-17 NOTE — Discharge Instructions (Signed)
-  Okay for full weightbearing as tolerated to the left lower extremity.  Use crutches as needed.  He may also bend the knee as tolerated.  -You may remove your postoperative bandages in 2 days.  You may begin showering at that time.  Please do not submerge underwater.  -Apply ice to the left knee for 20 to 30 minutes at a time out of each hour that you are awake.  She do this around-the-clock and as often as possible.  -For mild to moderate pain use Tylenol and Advil around-the-clock, while alternating between the two.  For breakthrough pain use oxycodone as needed every 4 hours.  - take 40 mg of lovenox daily for 2 weeks to prevent DVT

## 2019-10-17 NOTE — Brief Op Note (Signed)
10/17/2019  8:30 AM  PATIENT:  Sandra Boone  34 y.o. female  PRE-OPERATIVE DIAGNOSIS:  Left knee medial meniscus tear, medial tibia stress fracture  POST-OPERATIVE DIAGNOSIS:  Left knee medial meniscus tear, medial tibia stress fracture  PROCEDURE:  Procedure(s) with comments: Left knee arthroscopy with partial medial meniscectomy and medial tibia subchondroplasty (Left) - 90 mins Use Beane's block time  SURGEON:  Surgeon(s) and Role:    * Aundria Rud, Noah Delaine, MD - Primary  PHYSICIAN ASSISTANT:   ASSISTANTS: none   ANESTHESIA:   local and general  EBL:  5 cc  BLOOD ADMINISTERED:none  DRAINS: none   LOCAL MEDICATIONS USED:  MARCAINE     SPECIMEN:  No Specimen  DISPOSITION OF SPECIMEN:  N/A  COUNTS:  YES  TOURNIQUET:  * No tourniquets in log *  DICTATION: .Note written in EPIC  PLAN OF CARE: Discharge to home after PACU  PATIENT DISPOSITION:  PACU - hemodynamically stable.   Delay start of Pharmacological VTE agent (>24hrs) due to surgical blood loss or risk of bleeding: not applicable

## 2019-10-17 NOTE — Transfer of Care (Signed)
Immediate Anesthesia Transfer of Care Note  Patient: Sandra Boone  Procedure(s) Performed: Left knee arthroscopy with partial medial meniscectomy and medial tibia subchondroplasty (Left Knee)  Patient Location: PACU  Anesthesia Type:General  Level of Consciousness: awake and patient cooperative  Airway & Oxygen Therapy: Patient Spontanous Breathing  Post-op Assessment: Report given to RN and Post -op Vital signs reviewed and stable  Post vital signs: Reviewed and stable  Last Vitals:  Vitals Value Taken Time  BP 96/51 10/17/19 0836  Temp 36.6 C 10/17/19 0835  Pulse 80 10/17/19 0839  Resp 13 10/17/19 0839  SpO2 93 % 10/17/19 0839  Vitals shown include unvalidated device data.  Last Pain:  Vitals:   10/17/19 0835  TempSrc:   PainSc: 8       Patients Stated Pain Goal: 3 (10/17/19 5379)  Complications: No complications documented.

## 2019-10-17 NOTE — Progress Notes (Signed)
Tearful/emotional. "I just wanna hug my baby"/ apologizes for feeling this way, reassured nothing to worry about

## 2019-10-17 NOTE — H&P (Signed)
ORTHOPAEDIC H and P  REQUESTING PHYSICIAN: Yolonda Kida, MD  PCP:  Patient, No Pcp Per  Chief Complaint: Left knee pain.  HPI: Sandra Boone is a 34 y.o. female who complains of increasing left knee pain since an injury at work back in the spring of this past year.  She is a Runner, broadcasting/film/video.  She did went on to deliver a healthy baby.  She has continued to deal with the knee pain.  She does have an unstable meniscus tear as well as stress fracture of the proximal medial tibial plateau.  She is here today for operative management.  No new complaints.  Past Medical History:  Diagnosis Date  . Depression 2021  . Frequent headaches   . History of frequent urinary tract infections   . History of kidney stones 2019  . Hyperlipidemia   . Hypertension   . Hypothyroidism   . Migraines    Past Surgical History:  Procedure Laterality Date  . HAND SURGERY Right   . WISDOM TOOTH EXTRACTION Bilateral    Social History   Socioeconomic History  . Marital status: Married    Spouse name: Not on file  . Number of children: 3  . Years of education: 68  . Highest education level: Not on file  Occupational History  . Occupation: Runner, broadcasting/film/video  Tobacco Use  . Smoking status: Former Smoker    Types: Cigarettes    Quit date: 2002    Years since quitting: 19.5  . Smokeless tobacco: Never Used  Vaping Use  . Vaping Use: Never used  Substance and Sexual Activity  . Alcohol use: Not Currently    Comment: Rarely   . Drug use: No  . Sexual activity: Yes    Birth control/protection: None, I.U.D.    Comment: Liletta IUD   Other Topics Concern  . Not on file  Social History Narrative   Fun/Hobby: Walk, sleep    Denies abuse and feels safe at home.    Social Determinants of Health   Financial Resource Strain:   . Difficulty of Paying Living Expenses:   Food Insecurity:   . Worried About Programme researcher, broadcasting/film/video in the Last Year:   . Barista in the Last Year:   Transportation Needs:    . Freight forwarder (Medical):   Marland Kitchen Lack of Transportation (Non-Medical):   Physical Activity:   . Days of Exercise per Week:   . Minutes of Exercise per Session:   Stress:   . Feeling of Stress :   Social Connections:   . Frequency of Communication with Friends and Family:   . Frequency of Social Gatherings with Friends and Family:   . Attends Religious Services:   . Active Member of Clubs or Organizations:   . Attends Banker Meetings:   Marland Kitchen Marital Status:    Family History  Problem Relation Age of Onset  . Heart failure Maternal Grandmother   . Hypertension Maternal Grandmother   . Diabetes Maternal Grandmother   . Heart failure Maternal Grandfather   . Hyperlipidemia Maternal Grandfather   . Heart failure Mother   . Hypothyroidism Mother   . Hyperlipidemia Mother   . Hypertension Mother   . Lupus Sister   . Hypothyroidism Daughter    Allergies  Allergen Reactions  . Aspirin Hives  . Camila [Norethindrone] Hives    Hair loss  . Avocado Itching and Rash   Prior to Admission medications   Medication Sig  Start Date End Date Taking? Authorizing Provider  acetaminophen (TYLENOL) 325 MG tablet Take 650 mg by mouth every 6 (six) hours as needed for moderate pain.    Yes [provider]  fluticasone (FLONASE) 50 MCG/ACT nasal spray Place 1 spray into both nostrils daily as needed for allergies or rhinitis.   Yes [provider]  labetalol (NORMODYNE) 200 MG tablet Take 200 mg by mouth 3 (three) times daily.    Yes [provider]  levonorgestrel (LILETTA, 52 MG,) 19.5 MCG/DAY IUD IUD 1 each by Intrauterine route once. Inserted 09/2019   Yes [provider]  levothyroxine (SYNTHROID) 125 MCG tablet Take 125 mcg by mouth daily before breakfast.    Yes [provider]  Prenatal Vit-Fe Fumarate-FA (PRENATAL VITAMIN PO) Take 1 tablet by mouth daily.    Yes [provider]   No results found.  Positive ROS:  All other systems have been reviewed and were otherwise negative with the exception of those mentioned in the HPI and as above.  Physical Exam: General: Alert, no acute distress Cardiovascular: No pedal edema Respiratory: No cyanosis, no use of accessory musculature GI: No organomegaly, abdomen is soft and non-tender Skin: No lesions in the area of chief complaint Neurologic: Sensation intact distally Psychiatric: Patient is competent for consent with normal mood and affect Lymphatic: No axillary or cervical lymphadenopathy  MUSCULOSKELETAL:  Left lower extremity is warm and well-perfused.  No open wounds.  Distally neurovascularly intact.  Assessment: One.  Left knee acute medial meniscus tear  2.  Left knee proximal medial tibial stress reaction/fracture.  Plan: Our plan will be to proceed with left knee arthroscopy with partial medial meniscectomy and debridements as indicated.  We will also perform a subchondroplasty to support the stress fracture in the medial tibial condyle.  We again discussed the risk and benefits of this procedure at length.  All questions were solicited and answered to her satisfaction.  -She has provided informed consent to proceed today.  -We will plan for discharge home postoperatively from PACU as long she is doing well.    Yolonda Kida, MD Cell [phone]    10/17/2019 7:16 AM

## 2019-10-17 NOTE — Anesthesia Postprocedure Evaluation (Signed)
Anesthesia Post Note  Patient: Social research officer, government  Procedure(s) Performed: Left knee arthroscopy with partial medial meniscectomy and medial tibia subchondroplasty (Left Knee)     Patient location during evaluation: PACU Anesthesia Type: General Level of consciousness: awake and alert Pain management: pain level controlled Vital Signs Assessment: post-procedure vital signs reviewed and stable Respiratory status: spontaneous breathing, nonlabored ventilation and respiratory function stable Cardiovascular status: blood pressure returned to baseline and stable Postop Assessment: no apparent nausea or vomiting Anesthetic complications: no   No complications documented.  Last Vitals:  Vitals:   10/17/19 0900 10/17/19 0905  BP:  (!) 110/58  Pulse:  65  Resp:  11  Temp:    SpO2: 96% 93%    Last Pain:  Vitals:   10/17/19 0848  TempSrc:   PainSc: 4                  Beryle Lathe

## 2019-10-18 ENCOUNTER — Encounter (HOSPITAL_COMMUNITY): Payer: Self-pay | Admitting: Orthopedic Surgery

## 2019-10-25 ENCOUNTER — Ambulatory Visit (HOSPITAL_COMMUNITY)
Admission: RE | Admit: 2019-10-25 | Discharge: 2019-10-25 | Disposition: A | Discharge status: Home / Self Care | Payer: BC Managed Care – PPO | Source: Ambulatory Visit | Attending: Cardiovascular Disease | Admitting: Cardiovascular Disease

## 2019-10-25 ENCOUNTER — Other Ambulatory Visit (HOSPITAL_COMMUNITY): Payer: Self-pay | Admitting: Orthopedic Surgery

## 2019-10-25 ENCOUNTER — Other Ambulatory Visit: Payer: Self-pay

## 2019-10-25 DIAGNOSIS — M7989 Other specified soft tissue disorders: Secondary | ICD-10-CM | POA: Diagnosis not present

## 2019-10-25 DIAGNOSIS — M79662 Pain in left lower leg: Secondary | ICD-10-CM | POA: Insufficient documentation

## 2021-03-17 ENCOUNTER — Ambulatory Visit: Payer: BC Managed Care – PPO | Admitting: Physical Therapy

## 2021-05-01 IMAGING — RF DG KNEE 3 VIEWS*L*
1 series · 3 of 3 positions shown · non-contrast
Comparison: None.

CLINICAL DATA: Medial tibial subchondroplasty

EXAM:
LEFT KNEE - 3 VIEW; DG C-ARM 1-60 MIN

[Series 1: unknown protocol · 0.14mm/px · 3 of 3 slices shown]
[im 1/3]
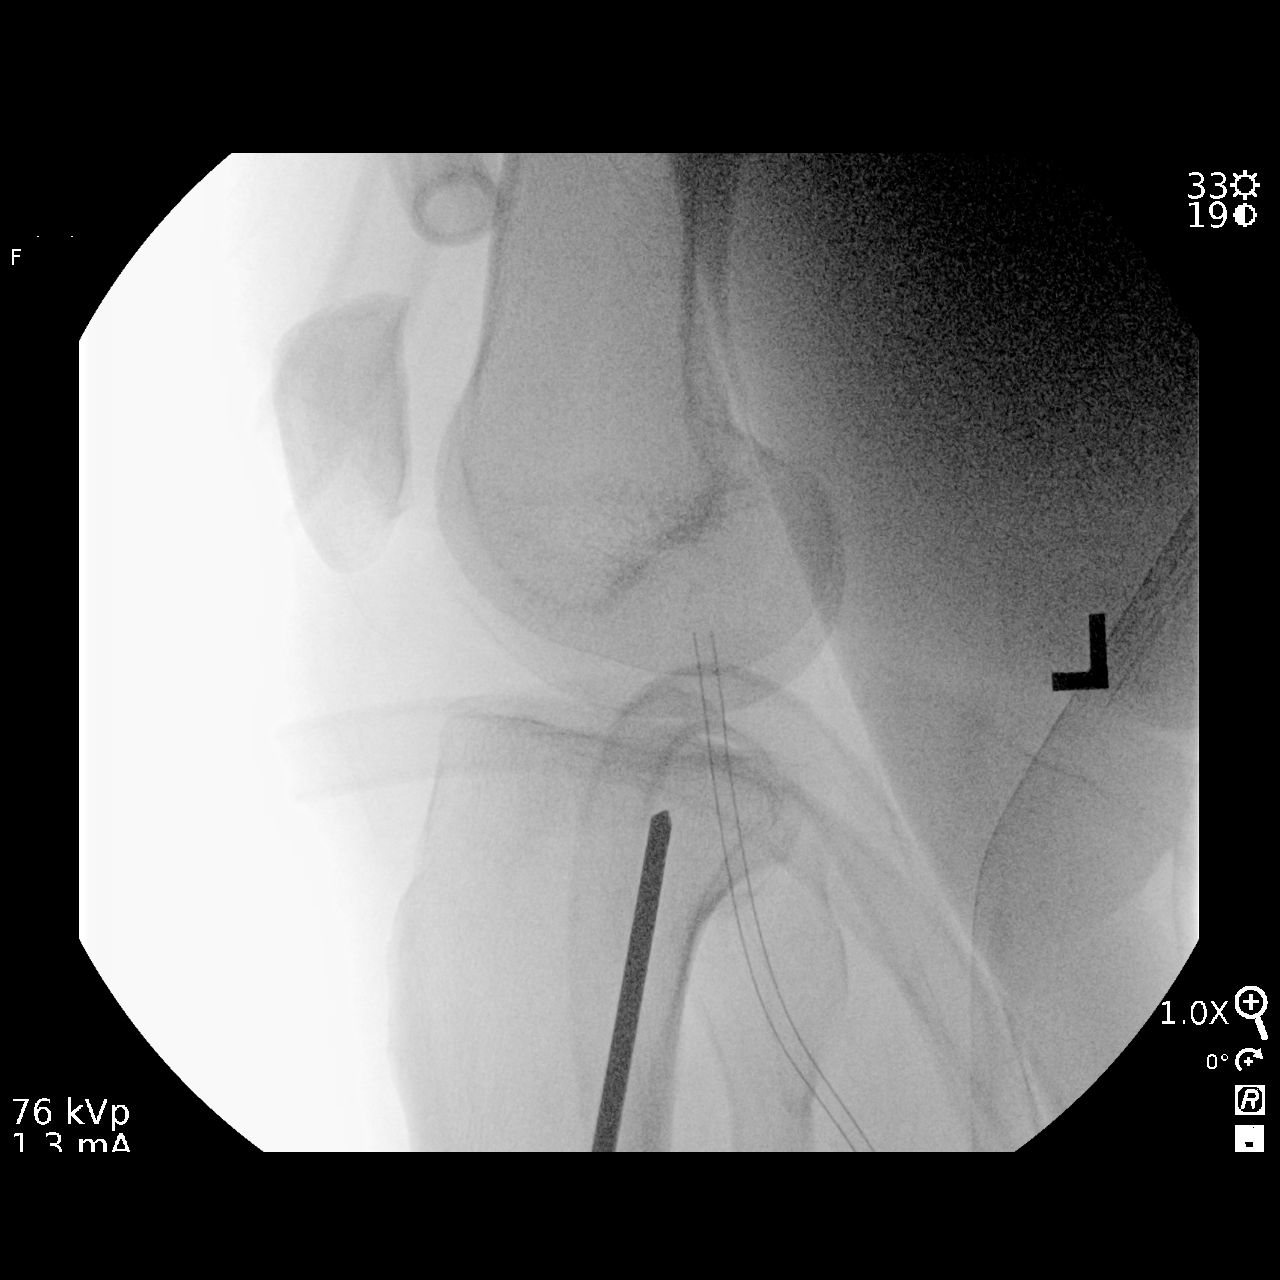
[im 2/3]
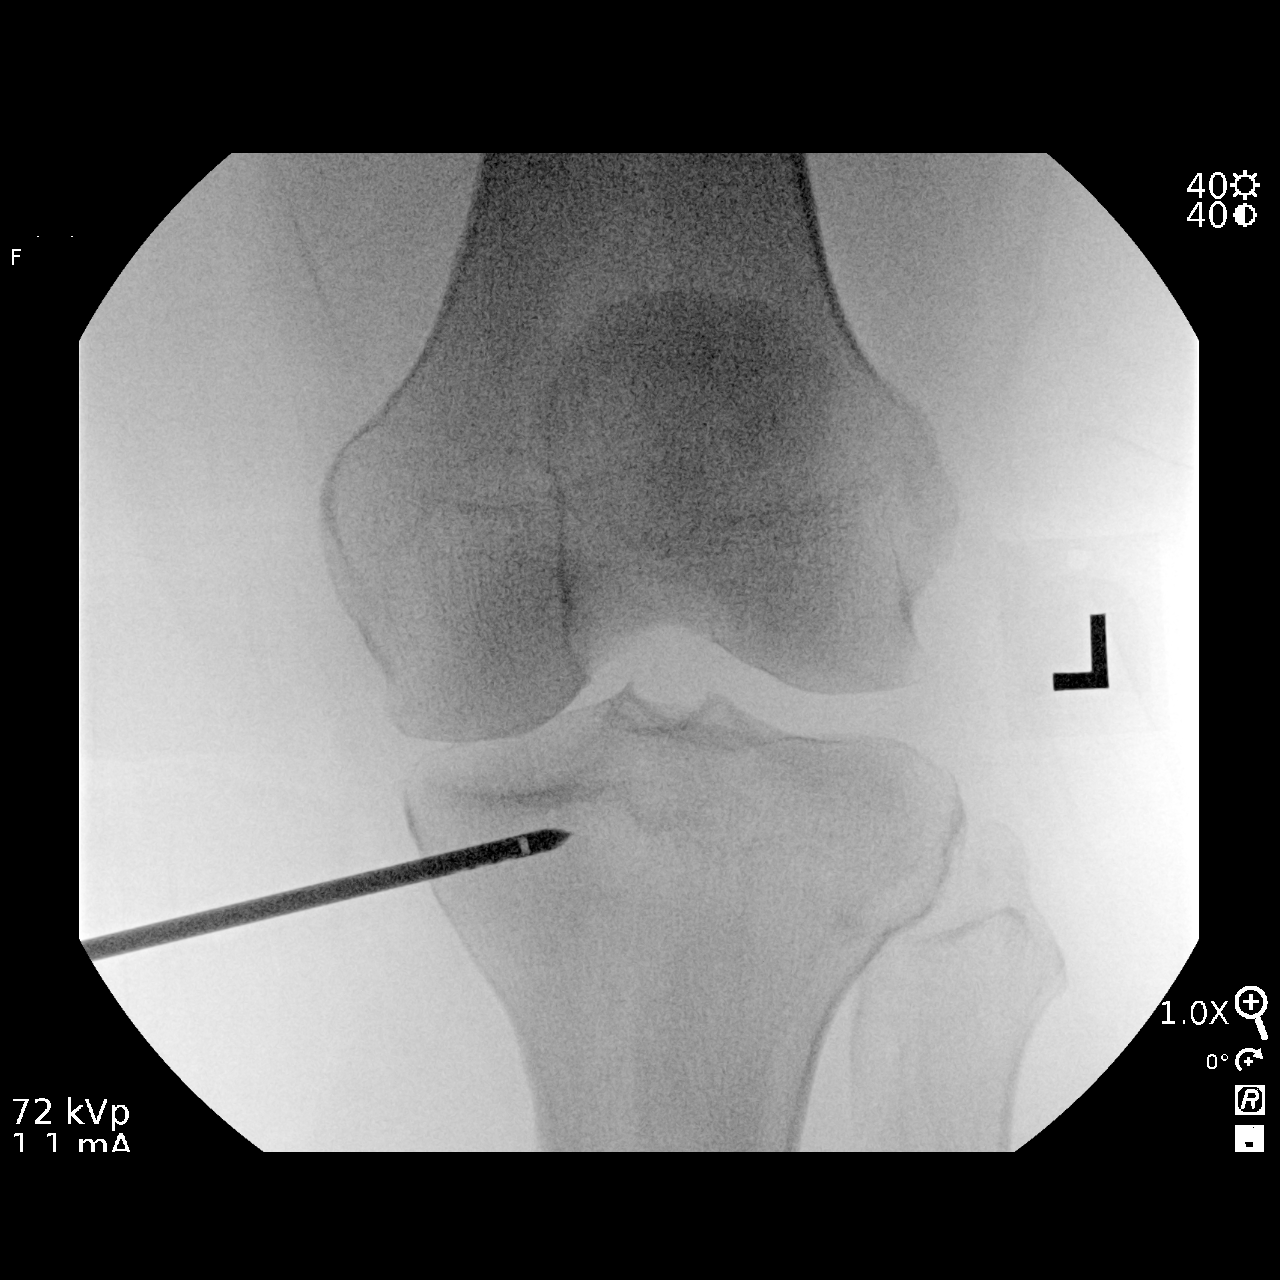
[im 3/3]
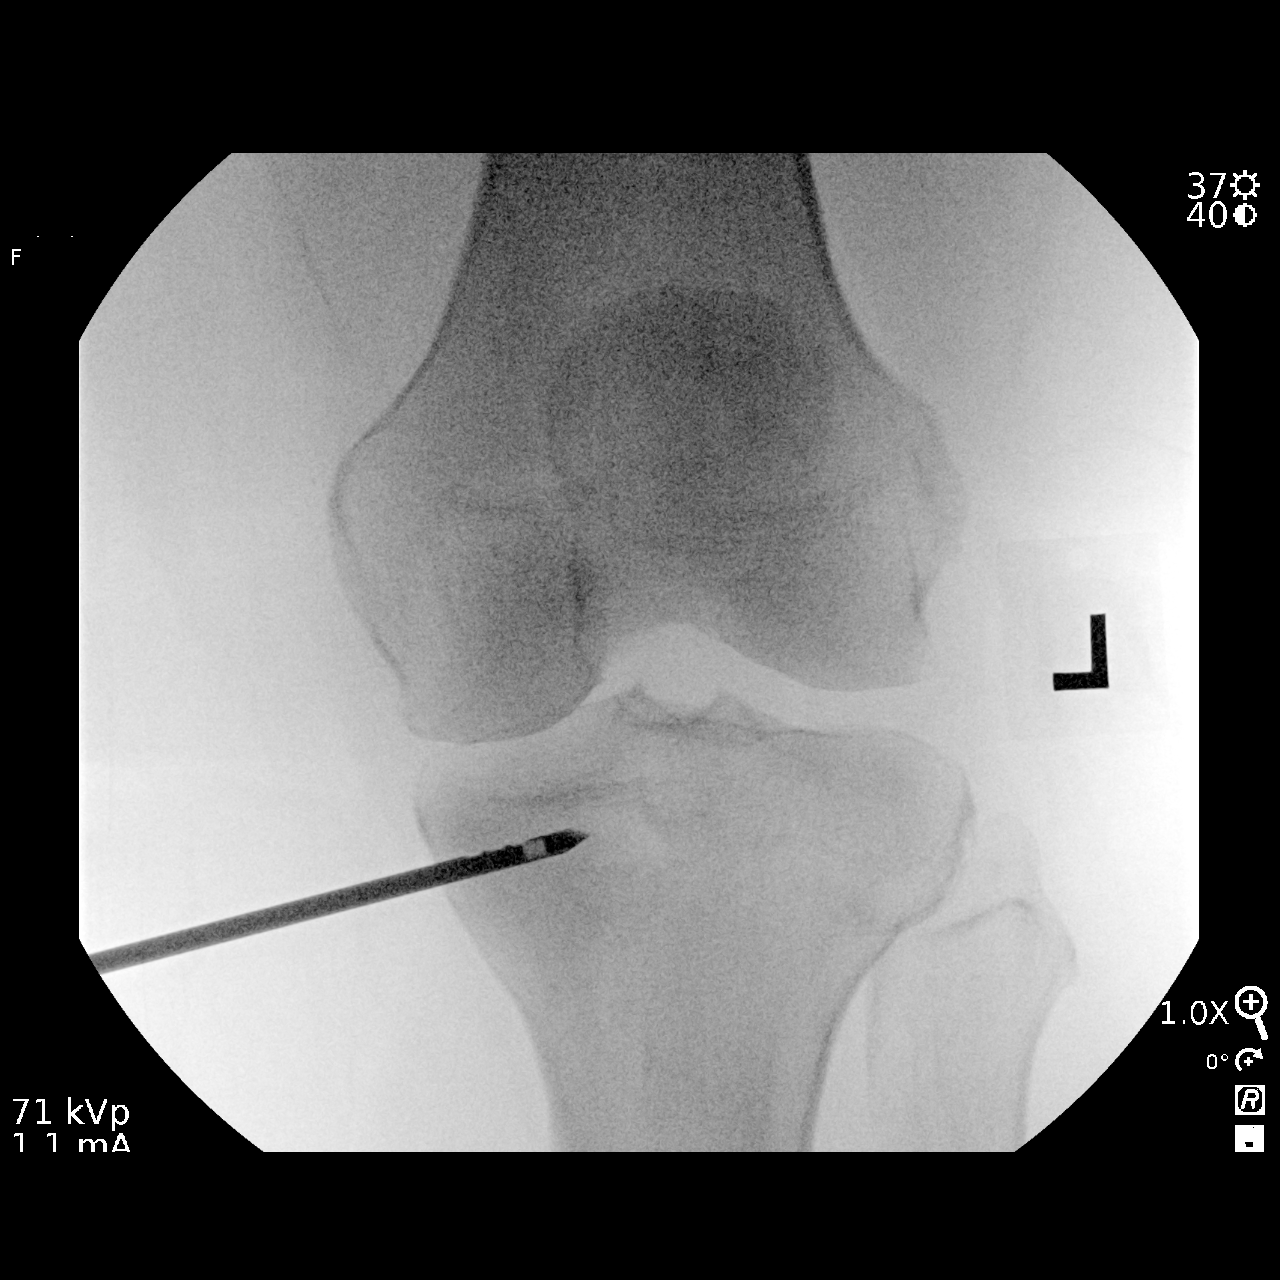

[3 of 3 positions shown; findings below may reference images not displayed]

FLUOROSCOPY TIME:  Fluoroscopy Time:  11 seconds

Radiation Exposure Index (if provided by the fluoroscopic device):
0.59 mGy

Number of Acquired Spot Images: 3
FINDINGS: Trocar is noted within the medial proximal tibia just beneath the
medial tibial plateau. Subsequent injection of calcium phosphate
material is seen.
IMPRESSION: Medial subchondroplasty

## 2023-10-02 ENCOUNTER — Other Ambulatory Visit (HOSPITAL_COMMUNITY): Payer: Self-pay | Admitting: General Surgery

## 2023-10-02 DIAGNOSIS — G43809 Other migraine, not intractable, without status migrainosus: Secondary | ICD-10-CM

## 2023-10-02 DIAGNOSIS — R5383 Other fatigue: Secondary | ICD-10-CM

## 2023-10-02 DIAGNOSIS — E039 Hypothyroidism, unspecified: Secondary | ICD-10-CM

## 2023-10-09 ENCOUNTER — Encounter (HOSPITAL_COMMUNITY)
Admission: RE | Admit: 2023-10-09 | Discharge: 2023-10-09 | Disposition: A | Discharge status: Home / Self Care | Source: Ambulatory Visit | Attending: General Surgery | Admitting: General Surgery

## 2023-10-09 ENCOUNTER — Ambulatory Visit (HOSPITAL_COMMUNITY)
Admission: RE | Admit: 2023-10-09 | Discharge: 2023-10-09 | Disposition: A | Discharge status: Home / Self Care | Payer: Self-pay | Source: Ambulatory Visit | Attending: General Surgery | Admitting: General Surgery

## 2023-10-09 ENCOUNTER — Other Ambulatory Visit: Payer: Self-pay

## 2023-10-09 DIAGNOSIS — G43809 Other migraine, not intractable, without status migrainosus: Secondary | ICD-10-CM

## 2023-10-09 DIAGNOSIS — E039 Hypothyroidism, unspecified: Secondary | ICD-10-CM | POA: Insufficient documentation

## 2023-10-09 DIAGNOSIS — R5383 Other fatigue: Secondary | ICD-10-CM | POA: Diagnosis present

## 2023-10-09 DIAGNOSIS — Z01818 Encounter for other preprocedural examination: Secondary | ICD-10-CM | POA: Diagnosis present

## 2023-10-10 ENCOUNTER — Ambulatory Visit (INDEPENDENT_AMBULATORY_CARE_PROVIDER_SITE_OTHER): Payer: Self-pay | Admitting: Licensed Clinical Social Worker

## 2023-10-10 DIAGNOSIS — F4320 Adjustment disorder, unspecified: Secondary | ICD-10-CM
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
## 2023-10-19 ENCOUNTER — Encounter: Payer: Self-pay | Attending: General Surgery | Admitting: Dietician

## 2023-10-19 ENCOUNTER — Encounter: Payer: Self-pay | Admitting: Dietician

## 2023-10-19 VITALS — Ht 65.0 in | Wt 388.2 lb

## 2023-10-19 DIAGNOSIS — E669 Obesity, unspecified: Secondary | ICD-10-CM | POA: Insufficient documentation

## 2023-10-19 NOTE — Progress Notes (Signed)
 Nutrition Assessment for Bariatric Surgery: Pre-Surgery Behavioral and Nutrition Intervention Program   Medical Nutrition Therapy  Appt Start Time: 1028    End Time: 1132  Patient was seen on 10/19/2023 for Pre-Operative Nutrition Assessment. Purpose of todays visit  enhance perioperative outcomes along with a healthy weight maintenance   Referral stated Supervised Weight Loss (SWL) visits needed: 12  Planned surgery: Sleeve Gastrectomy Pt expectation of surgery: to get to 200 or just under 200 lbs as a long term goal. Also gain more energy, move around more  NUTRITION ASSESSMENT   Anthropometrics  Start weight at [facility]: 388.2 lbs (date: 10/19/2023)  Height: 65 in BMI: 64.60 kg/m2     Clinical   Pharmacotherapy: History of weight loss medication used: 2005 Alli (side effects and became less available and expensive), 2022 Wegovy (positive experience at first with 50 lb weight loss and more active, then insurance did not cover... after stopping rapid wt gain and more); Qsymia (insurance stopped covering); phentermine and metformin  Medical hx: HTN, obesity Medications: levothyroxine , metformin, phentermine, losartan, hydrochlorothiazide  Supplements: Vit D drops Labs: Vit D 25.5, LDL 105; TSH 6.080 Notable signs/symptoms: none noted Any previous deficiencies? No  Evaluation of Nutritional Deficiencies: Micronutrient Nutrition Focused Physical Exam: Hair: No issues observed Eyes: No issues observed Mouth: No issues observed Neck: No issues observed Nails: No issues observed Skin: No issues observed  Lifestyle & Dietary Hx  Pt states she still tries to maintain portion control today. 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 Pt states rice hurts to eat, stating she feels bloated. Pt states she has had kidney stones one time.  Current Physical Activity Recommendations state 150 minutes per week of moderate to vigorous movement including Cardio and 1-2  days of resistance activities as well as flexibility/balance activities:  Pts current physical activity: walking daily, 30-40 minutes, with 75-100% recommendation reached   Sleep Hygiene: duration and quality: okay, better than before, stating she used have trouble staying asleep, but better now.  Current Patient Perceived Stress Level as stated by pt on a scale of 1-10:  4-5   Stress Management Techniques: meet with therapist every two week, breathing techniques  According to the Dietary Guidelines for Americans Recommendation: equivalent 1.5-2 cups fruits per day, equivalent 2-3 cups vegetables per day and at least half all grains whole  Fruit servings per day (on average): 1-2, meeting 66-100% recommendation  Non-starchy vegetable servings per day (on average): 3, meeting 100% recommendation  Whole Grains per day (on average): 0-1  Number of meals missed/skipped per week out of 21: during the school year 5; during the summer 2-3  24-Hr Dietary Recall First Meal: skip or shake (protein powder with ripple milk, spinach, ginger powder) Snack:  Second Meal: skip or meal prepped as spinach salad with feta cheese, chicken, cherry tomatoes, lime juice Snack: fruit Third Meal: spaghetti squash, chicken or marinara sauce with ground chicken/beef or chicken meatballs or quinoa bowl with chicken and feta Snack: crunchable's (dried apple chips with cinnamon) Beverages: water, mineral water, iced coffee with ripple milk or skinning mixes  Alcoholic beverages per week: 0 (none since 2020, due to medications)   Estimated Energy Needs Calories: 1500   NUTRITION DIAGNOSIS  Overweight/obesity (-3.3) related to past poor dietary habits and physical inactivity as evidenced by patient w/ planned sleeve surgery following dietary guidelines for continued weight loss.    NUTRITION INTERVENTION  Nutrition counseling (C-1) and education (E-2) to facilitate bariatric surgery goals.  Educated  pt on  micronutrient deficiencies post-surgery and behavioral/dietary strategies to start in order to mitigate that risk   Behavioral and Dietary Interventions Pre-Op Goals Reviewed with the Patient Nutrition: Healthy Eating Behaviors Switch to non-caloric, non-carbonated and non-caffeinated beverages such as  water, unsweetened tea, Crystal Light and zero calorie beverages (aim for 64 oz. per day) Cut out grazing between meals or at night  Find a protein shake you like Eat every 3-5 hours        Eliminate distractions while eating (TV, computer, reading, driving, texting) Take 79-69 minutes to eat a meal  Decrease high sugar foods/decrease high fat/fried foods Eliminate alcoholic beverages Increase protein intake (eggs, fish, chicken, yogurt) before surgery Eat non starchy vegetables 2 times a day 7 days a week Eat complex carbohydrates such as whole grains and fruits   Behavioral Modification: Physical Activity Increase my usual daily activity (use stairs, park farther, etc.) Engage in _______________________  activity  _______ minutes ______ times per week  Other:    _________________________________________________________________     Problem Solving I will think about my usual eating patterns and how to tweak them How can my friends and family support me Barriers to starting my changes Learn and understand appetite verses hunger   Healthy Coping Allow for ___________ activities per week to help me manage stress Reframe negative thoughts I will keep a picture of someone or something that is my inspiration & look at it daily   Monitoring  Weigh myself once a week  Measure my progress by monitoring how my clothes fit Keep a food record of what I eat and drink for the next ________ (time period) Take pictures of what I eat and drink for the next ________ (time period) Use an app to count steps/day for the next_______ (time period) Measure my progress such as increased energy and more  restful sleep Monitor your acid reflux and bowel habits, are they getting better?   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

## 2023-11-03 ENCOUNTER — Encounter: Payer: Self-pay | Admitting: Dietician

## 2023-11-03 ENCOUNTER — Encounter: Attending: General Surgery | Admitting: Dietician

## 2023-11-03 VITALS — Ht 65.0 in | Wt 387.8 lb

## 2023-11-03 DIAGNOSIS — E669 Obesity, unspecified: Secondary | ICD-10-CM | POA: Diagnosis present

## 2023-11-03 NOTE — Progress Notes (Signed)
 Supervised Weight Loss Visit Bariatric Nutrition Education Appt Start Time: 0755    End Time: 0821  Planned surgery: Sleeve Gastrectomy Pt expectation of surgery: to get to 200 or just under 200 lbs as a long term goal. Also gain more energy, move around more  Referral stated Supervised Weight Loss (SWL) visits needed: 12  1 out of 12 SWL Appointments   NUTRITION ASSESSMENT   Anthropometrics  Start weight at [facility]: 388.2 lbs (date: 10/19/2023)  Height: 65 in Weight today: 387.8 lb BMI: 64.53 kg/m2     Clinical   Pharmacotherapy: History of weight loss medication used: 2005 Alli (side effects and became less available and expensive), 2022 Wegovy (positive experience at first with 50 lb weight loss and more active, then insurance did not cover... after stopping rapid wt gain and more); Qsymia (insurance stopped covering); phentermine and metformin  Medical hx: HTN, obesity Medications: levothyroxine , metformin, phentermine, losartan, hydrochlorothiazide  Supplements: Vit D drops Labs: Vit D 25.5, LDL 105; TSH 6.080 Notable signs/symptoms: none noted Any previous deficiencies? 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 Pt states that slowing down at mealtime has gone terribly, stating she was aware of chewing well, but still ate fast at meal time. Pt states it was more difficult than she thought it would be. Pt states she understands that she needs to recognize satisfaction before fullness.  Estimated daily fluid intake: 40 oz stanley 2-3 times a day (approx. 60-70 oz) Supplements: Vit D drops Current average weekly physical activity: walking on walking pad daily for 30 minutes  24-Hr Dietary Recall First Meal: skip or shake (protein powder with ripple milk, spinach, ginger powder) Snack:  Second Meal: skip or meal prepped as spinach salad with feta cheese, chicken, cherry tomatoes, lime juice Snack: fruit Third Meal:  spaghetti squash, chicken or marinara sauce with ground chicken/beef or chicken meatballs or quinoa bowl with chicken and feta Snack: crunchable's (dried apple chips with cinnamon) Beverages: water, mineral water, iced coffee with ripple milk or skinny mixes  Alcoholic beverages per week: 0 (none since 2020, due to medications)   Estimated Energy Needs Calories: 1500  NUTRITION DIAGNOSIS  Overweight/obesity ([place]-3.3) related to past poor dietary habits and physical inactivity as evidenced by patient w/ planned sleeve surgery following dietary guidelines for continued weight loss.  NUTRITION INTERVENTION  Nutrition counseling (C-1) and education (E-2) to facilitate bariatric surgery goals.  Taking 20-30 minutes to eat a meal, chewing thoroughly, and practicing mindful eating are essential habits for bariatric surgery success. Eating slowly allows time for the brain to register fullness, helping prevent overeating and discomfort due to the smaller stomach size after surgery. Chewing food well improves digestion and reduces the risk of blockages or nausea. Mindful eating--focusing on the meal without distractions--helps patients recognize satisfaction cues rather than eating until they feel full, which can be physically uncomfortable post-surgery. Practicing these habits before surgery builds the foundation for long-term success, reducing the risk of maladaptive behaviors like grazing or emotional eating that can hinder weight loss and health outcomes.  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  Next Steps  Patient is to return to [facility] in 2 weeks for next SWL visit.

## 2023-11-17 ENCOUNTER — Encounter: Attending: General Surgery | Admitting: Dietician

## 2023-11-17 ENCOUNTER — Encounter: Payer: Self-pay | Admitting: Dietician

## 2023-11-17 VITALS — Ht 65.0 in | Wt 387.3 lb

## 2023-11-17 DIAGNOSIS — E669 Obesity, unspecified: Secondary | ICD-10-CM | POA: Diagnosis present

## 2023-11-17 NOTE — Progress Notes (Signed)
 Supervised Weight Loss Visit Bariatric Nutrition Education Appt Start Time: 0839    End Time: 0905  Planned surgery: Sleeve Gastrectomy Pt expectation of surgery: to get under 200 lbs as a long term goal. Also gain more energy, move around more  Referral stated Supervised Weight Loss (SWL) visits needed: 12  2 out of 12 SWL Appointments   NUTRITION ASSESSMENT   Anthropometrics  Start weight at [facility]: 388.2 lbs (date: 10/19/2023)  Height: 65 in Weight today: 387.3 lb BMI: 64.45 kg/m2     Clinical   Pharmacotherapy: History of weight loss medication used: 2005 Alli (side effects and became less available and expensive), 2022 Wegovy (positive experience at first with 50 lb weight loss and more active, then insurance did not cover... after stopping rapid wt gain and more); Qsymia (insurance stopped covering); phentermine and metformin  Medical hx: HTN, obesity Medications: levothyroxine , metformin, phentermine, losartan, hydrochlorothiazide  Supplements: Vit D drops Labs: Vit D 25.5, LDL 105; TSH 6.080 Notable signs/symptoms: none noted Any previous deficiencies? No  Lifestyle & Dietary Hx  Pt states she is working toward 30 minutes to eat, stating she has made it about 20 minutes. Pt states she meal prepping lunch for work., stating she has some containers with compartments. Pt states eating breakfast within an hour or hour and a half is still hard, stating she will usually eat within two or two and a half hours. Pt states she is getting outside more with toddler and dog, for longer periods of time in the evening, stating she is not a morning person. Pt states she is struggling with food noise, stating she is satisfied and there is still food on her plate. Pt states she has been practicing not drinking with meals.  Estimated daily fluid intake: 40 oz stanley 2-3 times a day (80 oz) Supplements: Vit D drops Current average weekly physical activity: walking on walking pad daily for  30 minutes  24-Hr Dietary Recall First Meal: skip or shake (protein powder with ripple milk, spinach, ginger powder) Snack:  Second Meal: skip or meal prepped as spinach salad with feta cheese, chicken, cherry tomatoes, lime juice Snack: fruit Third Meal: spaghetti squash, chicken or marinara sauce with ground chicken/beef or chicken meatballs or quinoa bowl with chicken and feta Snack: crunchable's (dried apple chips with cinnamon) Beverages: water, mineral water, iced coffee with ripple milk or skinny mixes  Alcoholic beverages per week: 0 (none since 2020, due to medications)   Estimated Energy Needs Calories: 1500  NUTRITION DIAGNOSIS  Overweight/obesity ([place]-3.3) related to past poor dietary habits and physical inactivity as evidenced by patient w/ planned sleeve surgery following dietary guidelines for continued weight loss.  NUTRITION INTERVENTION  Nutrition counseling (C-1) and education (E-2) to facilitate bariatric surgery goals.  Encouraged patient to honor their body's internal hunger and fullness cues.  Throughout the day, check in mentally and rate hunger. Stop eating when satisfied not full regardless of how much food is left on the plate.  Get more if still hungry 20-30 minutes later.  The key is to honor satisfaction so throughout the meal, rate fullness factor and stop when comfortably satisfied not physically full. The key is to honor hunger and fullness without any feelings of guilt or shame.  Pay attention to what the internal cues are, rather than any external factors. This will enhance the confidence you have in listening to your own body and following those internal cues enabling you to increase how often you eat when you are hungry  not out of appetite and stop when you are satisfied not full.   Starting with smaller portions helps patients adjust to the post-op eating patterns. Key strategies include using smaller plates and utensils, chewing food thoroughly, eating  slowly. Use the bariatric MyPlate method to help with portions sizes.   Bariatric MyPlate method helps patients practice portion control and build sustainable eating habits. Meals should be served on a small 6-inch plate, with 49% of the plate filled with lean protein (like poultry, fish, eggs, or tofu), 30% with non-starchy vegetables (such as broccoli, peppers, or spinach), and 20% with high-fiber carbohydrates (like whole grains, fruits or starchy vegetables). Healthy fats like avocado or olive oil can be added in small amounts. Patients are encouraged to eat protein first, chew thoroughly, and stop when satisfied.  Pre-Op Goals Reviewed with the Patient  Goals Continue: slow down at meal time; take 20-30 minutes to eat. Continue: aim for satisfaction before fullness. Continue: practice not drinking with meals. 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  Next Steps  Patient is to return to [facility] in 2 weeks for next SWL visit.

## 2023-12-04 ENCOUNTER — Ambulatory Visit: Admitting: Skilled Nursing Facility1

## 2023-12-18 ENCOUNTER — Encounter: Attending: General Surgery | Admitting: Dietician

## 2023-12-18 ENCOUNTER — Encounter: Payer: Self-pay | Admitting: Dietician

## 2023-12-18 VITALS — Ht 65.0 in | Wt 380.6 lb

## 2023-12-18 DIAGNOSIS — E669 Obesity, unspecified: Secondary | ICD-10-CM | POA: Insufficient documentation

## 2023-12-18 NOTE — Progress Notes (Signed)
 Supervised Weight Loss Visit Bariatric Nutrition Education Appt Start Time: 0815    End Time: 0830  Planned surgery: Sleeve Gastrectomy Pt expectation of surgery: to get under 200 lbs as a long term goal. Also gain more energy, move around more  Referral stated Supervised Weight Loss (SWL) visits needed: 12  3 out of 12 SWL Appointments   NUTRITION ASSESSMENT   Anthropometrics  Start weight at [facility]: 388.2 lbs (date: 10/19/2023)  Height: 65 in Weight today: 380.6 lb BMI: 63.34 kg/m2     Clinical   Pharmacotherapy: History of weight loss medication used: 2005 Alli (side effects and became less available and expensive), 2022 Wegovy (positive experience at first with 50 lb weight loss and more active, then insurance did not cover... after stopping rapid wt gain and more); Qsymia (insurance stopped covering); phentermine and metformin  Medical hx: HTN, obesity Medications: levothyroxine , metformin, phentermine, losartan, hydrochlorothiazide  Supplements: Vit D drops Labs: Vit D 25.5, LDL 105; TSH 6.080 Notable signs/symptoms: none noted Any previous deficiencies? No  Lifestyle & Dietary Hx  Pt states her endocrinologist increased her levothyroxine  to 125 mg, stating due to some blood work that came back. Pt states she is falling short of her fluid intake, since school has started, stating she is trying to keep her water bottle right in front of her. Pt states she is doing better taking more time to eat. Pt states she is using divided containers for meal prepping. Pt states she is having pain in her foot, stating she goes to her ortho this week.  Estimated daily fluid intake: 40 oz stanley (twice when at home and not at work, once on work/school days) Supplements: Vit D drops Current average weekly physical activity: walking on walking pad daily for 30 minutes  24-Hr Dietary Recall First Meal: skip or shake (protein powder with ripple milk, spinach, ginger powder) Snack:  Second  Meal: skip or meal prepped as spinach salad with feta cheese, chicken, cherry tomatoes, lime juice Snack: fruit Third Meal: spaghetti squash, chicken or marinara sauce with ground chicken/beef or chicken meatballs or quinoa bowl with chicken and feta Snack: crunchable's (dried apple chips with cinnamon) Beverages: water, mineral water, iced coffee with ripple milk or skinny mixes  Alcoholic beverages per week: 0 (none since 2020, due to medications)   Estimated Energy Needs Calories: 1500  NUTRITION DIAGNOSIS  Overweight/obesity ([place]-3.3) related to past poor dietary habits and physical inactivity as evidenced by patient w/ planned sleeve surgery following dietary guidelines for continued weight loss.  NUTRITION INTERVENTION  Nutrition counseling (C-1) and education (E-2) to facilitate bariatric surgery goals.  Establishing a consistent rhythm for food and fluid intake is essential when preparing for bariatric surgery. Practicing scheduled meal and snack times helps regulate hunger cues and supports portion control, while separating fluid intake from meals--by avoiding drinking 30 minutes before and after eating--can improve digestion and reduce the risk of discomfort post-surgery. These habits not only prepare the body physically but also reinforce mindful eating behaviors that are critical for long-term success after surgery.  Pre-Op Goals Reviewed with the Patient  Goals Continue: slow down at meal time; take 20-30 minutes to eat. Continue: aim for satisfaction before fullness. Continue: practice not drinking with meals. Continue: start with smaller portions Re-engage: food and water rhythm to get fluid in and practice not drinking with meals, while eating at scheduled meal and snack times.  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  Next Steps  Patient is to return to [facility] in 2 weeks for next SWL visit.

## 2024-01-01 ENCOUNTER — Encounter: Attending: General Surgery | Admitting: Dietician

## 2024-01-01 ENCOUNTER — Encounter: Payer: Self-pay | Admitting: Dietician

## 2024-01-01 VITALS — Ht 65.0 in | Wt 380.3 lb

## 2024-01-01 DIAGNOSIS — E669 Obesity, unspecified: Secondary | ICD-10-CM | POA: Insufficient documentation

## 2024-01-01 NOTE — Progress Notes (Signed)
 Supervised Weight Loss Visit Bariatric Nutrition Education Appt Start Time: 0814    End Time: 0828  Planned surgery: Sleeve Gastrectomy Pt expectation of surgery: to get under 200 lbs as a long term goal. Also gain more energy, move around more  Referral stated Supervised Weight Loss (SWL) visits needed: 12  4 out of 12 SWL Appointments   NUTRITION ASSESSMENT   Anthropometrics  Start weight at [facility]: 388.2 lbs (date: 10/19/2023)  Height: 65 in Weight today: 380.3 lb BMI: 63.29 kg/m2     Clinical   Pharmacotherapy: History of weight loss medication used: 2005 Alli (side effects and became less available and expensive), 2022 Wegovy (positive experience at first with 50 lb weight loss and more active, then insurance did not cover... after stopping rapid wt gain and more); Qsymia (insurance stopped covering); phentermine and metformin  Medical hx: HTN, obesity Medications: levothyroxine , metformin, phentermine, losartan, hydrochlorothiazide  Supplements: Vit D drops Labs: Vit D 25.5, LDL 105; TSH 6.080 Notable signs/symptoms: none noted Any previous deficiencies? No  Lifestyle & Dietary Hx  Pt states she is drinking more water, stating she is carrying her water with her, and put on the white board in the classroom a reminder to drink more water. Pt states she gets dry mouth while eating due to medication, stating she is still working on not drinking with meals. Pt states she has some small containers to have smaller portions, stating she is satisfied with the smaller potions when she is slowing down and aiming for satisfaction. Pt states she is slowing down at dinner time as well, stating her family is having more conversations. Pt states she is using her body for physical activity. Pt states she has added more protein to her diet. Pt states she can track her protein on her own and then talk about it next visit.  Estimated daily fluid intake: 64+ oz stanley (twice when at home and  not at work, once on work/school days) Supplements: Vit D drops Current average weekly physical activity: after planning (1st period) she walking around the campus, 3 times per week 1 mile minutes, walking before or after dinner in the neighbor hood.  24-Hr Dietary Recall First Meal: skip or shake (protein powder with ripple milk, spinach, ginger powder) Snack:  Second Meal: skip or meal prepped as spinach salad with feta cheese, chicken, cherry tomatoes, lime juice Snack: fruit Third Meal: spaghetti squash, chicken or marinara sauce with ground chicken/beef or chicken meatballs or quinoa bowl with chicken and feta Snack: crunchable's (dried apple chips with cinnamon) Beverages: water, mineral water, iced coffee with ripple milk or skinny mixes  Alcoholic beverages per week: 0 (none since 2020, due to medications)   Estimated Energy Needs Calories: 1500  NUTRITION DIAGNOSIS  Overweight/obesity ([place]-3.3) related to past poor dietary habits and physical inactivity as evidenced by patient w/ planned sleeve surgery following dietary guidelines for continued weight loss.  NUTRITION INTERVENTION  Nutrition counseling (C-1) and education (E-2) to facilitate bariatric surgery goals.  Tracking protein intake after bariatric surgery is crucial to ensure proper healing, preserve lean muscle mass, and support long-term weight loss. Patients are typically advised to aim for at least 60 grams of protein per day, which can be challenging with limited stomach capacity. Including a source of protein with every meal or snack--even during the full liquid phase--helps distribute intake evenly throughout the day and makes it more manageable to meet daily goals. Consistent tracking also helps identify gaps early, allowing for timely adjustments with supplements  or food choices to prevent deficiencies.  Pre-Op Goals Reviewed with the Patient  Goals Continue: slow down at meal time; take 20-30 minutes to  eat Continue: aim for satisfaction before fullness Continue: practice not drinking with meals Continue: start with smaller portions Continue: food and water rhythm to get fluid in and practice not drinking with meals, while eating at scheduled meal and snack times. 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  Next Steps  Patient is to return to [facility] in 2 weeks for next SWL visit.

## 2024-01-22 ENCOUNTER — Encounter: Attending: General Surgery | Admitting: Dietician

## 2024-01-22 ENCOUNTER — Encounter: Payer: Self-pay | Admitting: Dietician

## 2024-01-22 VITALS — Ht 65.0 in | Wt 377.7 lb

## 2024-01-22 DIAGNOSIS — E669 Obesity, unspecified: Secondary | ICD-10-CM | POA: Insufficient documentation

## 2024-01-22 NOTE — Progress Notes (Signed)
 Supervised Weight Loss Visit Bariatric Nutrition Education Appt Start Time: 0800    End Time: 0825  Planned surgery: Sleeve Gastrectomy Pt expectation of surgery: to get under 200 lbs as a long term goal. Also gain more energy, move around more  Referral stated Supervised Weight Loss (SWL) visits needed: 12  5 out of 12 SWL Appointments   NUTRITION ASSESSMENT   Anthropometrics  Start weight at [facility]: 388.2 lbs (date: 10/19/2023)  Height: 65 in Weight today: 377.7 lb BMI: 62.85 kg/m2     Clinical   Pharmacotherapy: History of weight loss medication used: 2005 Alli (side effects and became less available and expensive), 2022 Wegovy (positive experience at first with 50 lb weight loss and more active, then insurance did not cover... after stopping rapid wt gain and more); Qsymia (insurance stopped covering); phentermine and metformin  Medical hx: HTN, obesity Medications: levothyroxine , metformin, phentermine, losartan, hydrochlorothiazide  Supplements: Vit D drops Labs: Vit D 25.5, LDL 105; TSH 6.080 Notable signs/symptoms: none noted Any previous deficiencies? No  Lifestyle & Dietary Hx  Pt states she has become more intentional taking her vit D, stating they are drops that she adds to her water bottle. Pt states she already takes a lot of medications and didn't want to take tablet/pill form. Pt states she is writing on her board to remind her to drink water. Pt states she has been using containers to help eat in a regular routine, without skipping meals, stating it helps to control portions. Pt states she has been tracking her protein, stating she didn't think she was getting 60 grams per day, but states she is getting close to 80 grams per day. Pt states she has been more mindful to prepare her meals and snacks. Pt states her sister delivered her baby yesterday. Pt states she is still practicing not drinking with her meals, stating that is going much better, and states she is  drinking more throughout the day.  Estimated daily fluid intake: 64+ oz stanley (twice when at home and not at work, once on work/school days) Daily protein:  Supplements: Vit D drops Current average weekly physical activity: after planning (1st period) she walking around the campus, 3 times per week 1 mile minutes, walking before or after dinner in the neighbor hood. Chair squats/stand.  24-Hr Dietary Recall First Meal: 1/2 cup egg whites and 1/4 - 1/2 cup cottage cheese with spinach baked, strawberries, cheese stick (weekend) Snack: more strawberries and cheese stick Second Meal: left overs (skirt steak, asparagus) Snack: pretzels Third Meal: taste good won ton (chicken and broccoli) Snack:  Beverages: water, mineral water, iced coffee with ripple milk or skinny mixes  Alcoholic beverages per week: 0 (none since 2020, due to medications)   Estimated Energy Needs Calories: 1500  NUTRITION DIAGNOSIS  Overweight/obesity ([place]-3.3) related to past poor dietary habits and physical inactivity as evidenced by patient w/ planned sleeve surgery following dietary guidelines for continued weight loss.  NUTRITION INTERVENTION  Nutrition counseling (C-1) and education (E-2) to facilitate bariatric surgery goals.  Using SMART goals--Specific, Measurable, Achievable, Relevant, and Time-bound--is a powerful way to build consistency and motivation in physical activity. Instead of vague intentions like "exercise more," a SMART goal might be: "Walk for 20 minutes, 5 days a week, for the next month." This approach helps you track progress, stay focused, and adjust as needed. For bariatric patients, setting realistic and personalized goals before and after surgery supports recovery, builds endurance, and reinforces long-term lifestyle changes.  Pre-Op Goals Reviewed  with the Patient  Goals Continue: slow down at meal time; take 20-30 minutes to eat Continue: aim for satisfaction before  fullness Continue: practice not drinking with meals Continue: start with smaller portions Continue: food and water rhythm to get fluid in and practice not drinking with meals, while eating at scheduled meal and snack times. 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  Next Steps  Patient is to return to [facility] in 2 weeks for next SWL visit.

## 2024-02-02 ENCOUNTER — Emergency Department (HOSPITAL_BASED_OUTPATIENT_CLINIC_OR_DEPARTMENT_OTHER)

## 2024-02-02 ENCOUNTER — Encounter (HOSPITAL_BASED_OUTPATIENT_CLINIC_OR_DEPARTMENT_OTHER): Payer: Self-pay

## 2024-02-02 ENCOUNTER — Emergency Department (HOSPITAL_BASED_OUTPATIENT_CLINIC_OR_DEPARTMENT_OTHER)
Admission: EM | Admit: 2024-02-02 | Discharge: 2024-02-02 | Disposition: A | Discharge status: Home / Self Care | Payer: Worker's Compensation | Source: Ambulatory Visit | Attending: Emergency Medicine | Admitting: Emergency Medicine

## 2024-02-02 ENCOUNTER — Other Ambulatory Visit: Payer: Self-pay

## 2024-02-02 DIAGNOSIS — S60932A Unspecified superficial injury of left thumb, initial encounter: Secondary | ICD-10-CM | POA: Diagnosis present

## 2024-02-02 DIAGNOSIS — S63602A Unspecified sprain of left thumb, initial encounter: Secondary | ICD-10-CM | POA: Insufficient documentation

## 2024-02-02 DIAGNOSIS — Y9389 Activity, other specified: Secondary | ICD-10-CM | POA: Insufficient documentation

## 2024-02-02 DIAGNOSIS — X501XXA Overexertion from prolonged static or awkward postures, initial encounter: Secondary | ICD-10-CM | POA: Insufficient documentation

## 2024-02-02 NOTE — ED Triage Notes (Signed)
 Pt c/o thumb injury at work yesterday, trying to set up a table, it was stuck closed & I was trying to pry it open & my thumb hyperextended. Pain & numbness since, limited  Ibuprofen  w limited relief.

## 2024-02-02 NOTE — ED Provider Notes (Signed)
 [place] EMERGENCY DEPARTMENT AT [facility] Provider Note   CSN: [id number] Arrival date & time: 02/02/24  1648     Patient presents with: Finger Injury (L thumb)   Sandra Boone is a 38 y.o. right-hand-dominant female who presents with concern for left thumb pain that occurred when opening a folding table earlier today.  Reports that when she was pushing against the table, her left thumb hyperextended.  It caused immediate pain and swelling to her thumb.  She reports a somewhat numb sensation to her left thumb, but can still feel touch to her left thumb.  Denies any other injuries.   HPI     Prior to Admission medications   Medication Sig Start Date End Date Taking? Authorizing Provider  acetaminophen  (TYLENOL ) 325 MG tablet Take 650 mg by mouth every 6 (six) hours as needed for moderate pain.     [provider]  enoxaparin  (LOVENOX ) 40 MG/0.4ML injection Inject 0.4 mLs (40 mg total) into the skin daily for 14 days. 10/17/19 10/31/19  Sharl Selinda Dover, MD  fluticasone (FLONASE) 50 MCG/ACT nasal spray Place 1 spray into both nostrils daily as needed for allergies or rhinitis.    [provider]  labetalol  (NORMODYNE ) 200 MG tablet Take 200 mg by mouth 3 (three) times daily.     [provider]  levonorgestrel (LILETTA, 52 MG,) 19.5 MCG/DAY IUD IUD 1 each by Intrauterine route once. Inserted 09/2019    [provider]  levothyroxine  (SYNTHROID ) 125 MCG tablet Take 125 mcg by mouth daily before breakfast.     [provider]  ondansetron  (ZOFRAN  ODT) 4 MG disintegrating tablet Take 1 tablet (4 mg total) by mouth every 8 (eight) hours as needed. 10/17/19   Sharl Selinda Dover, MD  Prenatal Vit-Fe Fumarate-FA (PRENATAL VITAMIN PO) Take 1 tablet by mouth daily.     [provider]    Allergies: Aspirin, Sandra Boone [norethindrone], and Avocado    Review of Systems  Musculoskeletal:        Left thumb pain    Updated  Vital Signs BP 121/73   Pulse (!) 106   Temp 98.2 F (36.8 C)   Resp 16   SpO2 96%   Physical Exam Vitals and nursing note reviewed.  Constitutional:      Appearance: Normal appearance.  HENT:     Head: Atraumatic.  Cardiovascular:     Comments: Brisk cap refill in the left thumb  2+ radial pulse on the left upper extremity Pulmonary:     Effort: Pulmonary effort is normal.  Musculoskeletal:     Comments: Left Thumb  General Diffuse edema over the left thumb.  No erythema, contusions, open wounds   Palpation Nontender over the carpal bones diffusely.  Nontender over the 1st through 5th metacarpals or phalanges.  ROM Full flexion extension at the wrist Full flexion extension at the 1st through 5th MCPs, PIPs, DIPs   Sensation: Sensation intact throughout the 1st-5th digits of left hand  Neurological:     General: No focal deficit present.     Mental Status: She is alert.  Psychiatric:        Mood and Affect: Mood normal.        Behavior: Behavior normal.     (all labs ordered are listed, but only abnormal results are displayed) Labs Reviewed - No data to display  EKG: None  Radiology: DG Finger Thumb Left Result Date: 02/02/2024 EXAM: 3 VIEW(S) XRAY OF THE LEFT FINGER(S) 02/02/2024  05:02:00 PM COMPARISON: None available. CLINICAL HISTORY: injury FINDINGS: BONES AND JOINTS: No acute osseous abnormality. No focal osseous lesion. No joint dislocation. SOFT TISSUES: Swelling about the left thumb. IMPRESSION: 1. Swelling about the left thumb, without acute osseous abnormality. Electronically signed by: Sandra Gatlin MD 02/02/2024 05:36 PM EST RP Workstation: [id number]     Procedures   Medications Ordered in the ED - No data to display                                  Medical Decision Making Amount and/or Complexity of Data Reviewed Radiology: ordered.    Differential diagnosis includes but is not limited to fracture, dislocation, sprain, strain,  contusion, laceration, nerve injury, vascular injury, compartment syndrome  ED Course:  Upon initial evaluation, patient is well-appearing, no acute distress.  Patient reporting pain to her left thumb.  She has diffuse edema of the left thumb on exam.  No point tenderness to palpation over the left thumb proximal or distal phalanx.  There is no overlying wounds or bruising.  She is neurovascularly intact in the left thumb, intact sensation and brisk cap refill. No concern for vascular or nerve injury. X-ray of the left thumb was obtained which did not show any acute fracture or dislocation.  Suspect that given the mechanism of hyperextension, she sprained her left thumb which is causing her pain and swelling.  She does have full range of motion of the left thumb with flexion and extension at the MCP and DIP, no concern for tendon rupture at this time.  Stable and appropriate for discharge home.   Imaging Studies ordered: I ordered imaging studies including x-ray left thumb I independently visualized the imaging with scope of interpretation limited to determining acute life threatening conditions related to emergency care. Imaging showed  IMPRESSION:  1. Swelling about the left thumb, without acute osseous abnormality.   I agree with the radiologist interpretation   Medications Given: None   Impression: Left thumb strain  Disposition:  Patient discharged home with instructions to apply ice to the thumb to help with pain and swelling. Tylenol  and ibuprofen  as needed for pain.  Follow-up with Dr. Agarwala if pain not improving within the next week, his contact information was provided. Work note provided  Return precautions given and patient verbalized understanding.   This chart was dictated using voice recognition software, Dragon. Despite the best efforts of this provider to proofread and correct errors, errors may still occur which can change documentation meaning.       Final  diagnoses:  Sprain of left thumb, unspecified site of digit, initial encounter    ED Discharge Orders     None          Sandra Boone, Sandra Boone 02/02/24 Sandra Yolande Lamar JAYSON, MD 02/06/24 [id number]

## 2024-02-02 NOTE — Discharge Instructions (Addendum)
 The x-ray of your left thumb did not show any fracture or dislocation.  I suspect you sustained a sprain of your left thumb. This should heal on its own with time.  Avoid movements and activities that are painful in the first couple of days after the injury. Elevate the area of injury if able to help limit swelling. Apply ice to the area to help with swelling.   You may take up to 1000mg  of tylenol  every 6 hours as needed for pain. Do not take more then 4g per day.   You may use up to 600mg  ibuprofen  every 6 hours as needed for pain.  Do not exceed 2.4g of ibuprofen  per day.  Gradually return to activity as pain allows. Try to engage in non-painful types of physical activity/exercise to increase blood flow to your area of injury.  Please follow-up with the orthopedic provider listed below if your pain is not starting to improve within the next week  Return to the ER for any numbness, uncontrolled pain, any other new or concerning symptoms

## 2024-02-05 ENCOUNTER — Encounter: Attending: General Surgery | Admitting: Dietician

## 2024-02-05 ENCOUNTER — Encounter: Payer: Self-pay | Admitting: Dietician

## 2024-02-05 DIAGNOSIS — E669 Obesity, unspecified: Secondary | ICD-10-CM | POA: Insufficient documentation

## 2024-02-05 NOTE — Progress Notes (Unsigned)
 Supervised Weight Loss Visit Bariatric Nutrition Education Appt Start Time: 0847    End Time: 0906  Virtual Visit via Video Note  I connected with Sandra Boone Code on 02/05/24 by a video enabled application  Planned surgery: Sleeve Gastrectomy Pt expectation of surgery: to get under 200 lbs as a long term goal. Also gain more energy, move around more  Referral stated Supervised Weight Loss (SWL) visits needed: 12  6 out of 12 SWL Appointments   NUTRITION ASSESSMENT   Anthropometrics  Start weight at [facility]: 388.2 lbs (date: 10/19/2023)  Height: 65 in Weight today: virtual visit BMI:  kg/m2     Clinical   Pharmacotherapy: History of weight loss medication used: 2005 Alli (side effects and became less available and expensive), 2022 Wegovy (positive experience at first with 50 lb weight loss and more active, then insurance did not cover... after stopping rapid wt gain and more); Qsymia (insurance stopped covering); phentermine and metformin  Medical hx: HTN, obesity Medications: levothyroxine , metformin, phentermine, losartan, hydrochlorothiazide  Supplements: Vit D drops Labs: Vit D 25.5, LDL 105; TSH 6.080 Notable signs/symptoms: none noted Any previous deficiencies? No  Lifestyle & Dietary Hx  Pt states she started adding her vitamin D to her breakfast shake every morning, stating so she would not forget to take it. Pt states her left knee has been hurting, stating she is waiting for insurance authorization to get some injections for pain relief. Pt states she is using smaller portions with her divided container for meals out and smaller plate at home. PT states she is trying to add more walking time, water and vegetables to avoid the feeling her food just sitting on her stomach.  Estimated daily fluid intake: 64+ oz stanley (twice when at home and not at work, once on work/school days) Daily protein:  Supplements: Vit D drops Current average weekly physical activity: after  planning (1st period) she walking around the campus, 3 times per week 1 mile minutes, walking before or after dinner in the neighbor hood. Chair squats/stand.  24-Hr Dietary Recall First Meal: 1/2 cup egg whites and 1/4 - 1/2 cup cottage cheese with spinach baked, strawberries, cheese stick (weekend) or shake (Ripple Shake with protein powder) Snack: more strawberries and cheese stick Second Meal: left overs (skirt steak, asparagus); Sunday (egg whites with cottage cheese with feta cheese) weekdays: cheese, strawberries/blueberries, chomps beef stick) Snack: pretzels Third Meal: broccoli, chicken (pan seared), onions Snack:  Beverages: water, mineral water, iced coffee with ripple milk or skinny mixes  Alcoholic beverages per week: 0 (none since 2020, due to medications)   Estimated Energy Needs Calories: 1500  NUTRITION DIAGNOSIS  Overweight/obesity ([place]-3.3) related to past poor dietary habits and physical inactivity as evidenced by patient w/ planned sleeve surgery following dietary guidelines for continued weight loss.  NUTRITION INTERVENTION  Nutrition counseling (C-1) and education (E-2) to facilitate bariatric surgery goals.  It's important not to consume too much protein in a single day or all at once, especially after bariatric surgery. Excess protein can strain your kidneys, cause digestive discomfort, and crowd out other essential nutrients. Your body absorbs protein more efficiently when it's spread evenly throughout the day, so aim to include moderate portions with each meal and snack. This approach supports muscle maintenance, satiety, and overall metabolic health without overwhelming your system.  Whenever possible, prioritize whole foods for your protein intake rather than relying heavily on supplements. Whole foods like lean meats, eggs, dairy, legumes, tofu, and fish provide not only high-quality  protein but also essential nutrients like iron, zinc, B vitamins, and healthy  fats that supplements often lack. They support better digestion, satiety, and long-term nutritional balance. While protein supplements can be helpful in certain situations--especially early post-op--building meals around whole food sources encourages sustainable habits and a more varied, satisfying diet.  Pre-Op Goals Reviewed with the Patient  Goals Continue: slow down at meal time; take 20-30 minutes to eat Continue: aim for satisfaction before fullness Continue: practice not drinking with meals Continue: start with smaller portions Continue: food and water rhythm to get fluid in and practice not drinking with meals, while eating at scheduled meal and snack times. 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  Next Steps  Patient is to return to [facility] in 2 weeks for next SWL visit.

## 2024-02-23 ENCOUNTER — Encounter: Admitting: Dietician

## 2024-02-29 ENCOUNTER — Encounter: Admitting: Dietician

## 2024-03-01 ENCOUNTER — Encounter: Attending: General Surgery | Admitting: Dietician

## 2024-03-01 ENCOUNTER — Encounter: Payer: Self-pay | Admitting: Dietician

## 2024-03-01 DIAGNOSIS — E669 Obesity, unspecified: Secondary | ICD-10-CM | POA: Insufficient documentation

## 2024-03-01 NOTE — Progress Notes (Signed)
 Supervised Weight Loss Visit Bariatric Nutrition Education Appt Start Time: 0912    End Time: 0924  Virtual Visit via Video  I connected with Jeoffrey JONELLE Code on 03/01/24 by a video enabled application  Planned surgery: Sleeve Gastrectomy Pt expectation of surgery: to get under 200 lbs as a long term goal. Also gain more energy, move around more  Referral stated Supervised Weight Loss (SWL) visits needed: 12  7 out of 12 SWL Appointments   NUTRITION ASSESSMENT   Anthropometrics  Start weight at [facility]: 388.2 lbs (date: 10/19/2023)  Height: 65 in Weight today: virtual visit BMI:  kg/m2     Clinical   Pharmacotherapy: History of weight loss medication used: 2005 Alli (side effects and became less available and expensive), 2022 Wegovy (positive experience at first with 50 lb weight loss and more active, then insurance did not cover... after stopping rapid wt gain and more); Qsymia (insurance stopped covering); phentermine and metformin  Medical hx: HTN, obesity Medications: levothyroxine , metformin, phentermine, losartan, hydrochlorothiazide  Supplements: Vit D drops Labs: Vit D 25.5, LDL 105; TSH 6.080 Notable signs/symptoms: none noted Any previous deficiencies? No  Lifestyle & Dietary Hx  Pt states she fell at work trying to step over a student's back pack a few days ago, stating her knee had swelled up. Pt arrived on virtual call from home. Pt states school/work was a virtual day, due to threat of bad weather/snow. Pt states she has been working on getting protein from food, instead of relying on protein supplements/shakes. Pt states she has been trying to meal prep more.  Estimated daily fluid intake: 64+ oz stanley (twice when at home and not at work, once on work/school days) Daily protein: 54 grams on average Supplements: Vit D drops Current average weekly physical activity: after planning (1st period) she walking around the campus, 3 times per week 1 mile minutes,  walking before or after dinner in the neighbor hood. Chair squats/stand.  24-Hr Dietary Recall First Meal: 1/2 cup egg whites and 1/4 - 1/2 cup cottage cheese with spinach baked, strawberries, cheese stick (weekend) or shake (Ripple Shake with protein powder) Snack: more strawberries and cheese stick Second Meal: left overs (skirt steak, asparagus); Sunday (egg whites with cottage cheese with feta cheese) weekdays: cheese, strawberries/blueberries, chomps beef stick) Snack: pretzels Third Meal: broccoli, chicken (pan seared), onions Snack:  Beverages: water, mineral water, iced coffee with ripple milk or skinny mixes  Alcoholic beverages per week: 0 (none since 2020, due to medications)   Estimated Energy Needs Calories: 1500  NUTRITION DIAGNOSIS  Overweight/obesity ([place]-3.3) related to past poor dietary habits and physical inactivity as evidenced by patient w/ planned sleeve surgery following dietary guidelines for continued weight loss.  NUTRITION INTERVENTION  Nutrition counseling (C-1) and education (E-2) to facilitate bariatric surgery goals.  Pairing protein with non-starchy vegetables or a small portion of complex carbohydrates before bariatric surgery is important because it helps balance nutrition, stabilize blood sugar, and promote fullness without excess calories. Non-starchy vegetables add fiber, vitamins, and minerals that support digestion and overall health, while complex carbohydrates like whole grains or legumes provide steady energy and prevent fatigue. Practicing this balanced approach before surgery builds habits that will be essential afterward, when portion sizes are smaller and nutrient-dense foods must be prioritized to meet daily needs.  Pre-Op Goals Reviewed with the Patient  Goals Continue: slow down at meal time; take 20-30 minutes to eat Continue: aim for satisfaction before fullness Continue: practice not drinking with meals Continue:  start with smaller  portions Continue: food and water rhythm to get fluid in and practice not drinking with meals, while eating at scheduled meal and snack times. Continue: track your protein; aim for 60 grams per day Continue: increase physical activity; aim for 4 days per week; for 20-30 minutes (5 minute increments for chair squats) Continue: try overnight oats, with greek yogurt or a small amount of protein powder (spread protein throughout the day; avoiding too much at one time; probably don't need 100 grams per day) New: include non-starchy vegetable or small amount of complex carbohydrates with your protein snack  Handouts Provided Include    Learning Style & Readiness for Change Teaching method utilized: Visual & Auditory  Demonstrated degree of understanding via: Teach Back  Readiness Level: preparation Barriers to learning/adherence to lifestyle change: 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  Next Steps  Patient is to return to [facility] in 2 weeks for next SWL visit.

## 2024-03-14 ENCOUNTER — Other Ambulatory Visit: Payer: Self-pay | Admitting: Family Medicine

## 2024-03-14 ENCOUNTER — Ambulatory Visit
Admission: RE | Admit: 2024-03-14 | Discharge: 2024-03-14 | Disposition: A | Source: Ambulatory Visit | Attending: Family Medicine | Admitting: Family Medicine

## 2024-03-14 DIAGNOSIS — W19XXXA Unspecified fall, initial encounter: Secondary | ICD-10-CM

## 2024-03-15 ENCOUNTER — Encounter: Attending: General Surgery | Admitting: Dietician

## 2024-03-15 ENCOUNTER — Encounter: Admitting: Dietician

## 2024-03-15 ENCOUNTER — Encounter: Payer: Self-pay | Admitting: Dietician

## 2024-03-15 VITALS — Ht 65.0 in | Wt 386.8 lb

## 2024-03-15 DIAGNOSIS — E669 Obesity, unspecified: Secondary | ICD-10-CM | POA: Insufficient documentation

## 2024-03-15 NOTE — Progress Notes (Signed)
 Supervised Weight Loss Visit Bariatric Nutrition Education Appt Start Time: 0933    End Time: 0950  Planned surgery: Sleeve Gastrectomy Pt expectation of surgery: to get under 200 lbs as a long term goal. Also gain more energy, move around more  Referral stated Supervised Weight Loss (SWL) visits needed: 12  8 out of 12 SWL Appointments   NUTRITION ASSESSMENT   Anthropometrics  Start weight at [facility]: 388.2 lbs (date: 10/19/2023)  Height: 65 in Weight today: 386.8 lbs (with boot for ankle... sprained a ligament) BMI: 64.37 kg/m2     Clinical  Medical hx: HTN, obesity Medications: levothyroxine , metformin, phentermine, losartan, hydrochlorothiazide  Supplements: Vit D drops Labs: Vit D 25.5, LDL 105; TSH 6.080 Notable signs/symptoms: none noted Any previous deficiencies? No  Lifestyle & Dietary Hx  Pt arrived with a boot, due to fall a few weeks ago. Pt states she started using oats and yogurt in a mix from [employer], stating she has tried to avoid using shakes so much. Pt states she is also trying to get in more vegetables. Pt states she really likes feta cheese a lot, stating she thinks an ounce has about 4 grams of proteins.  Estimated daily fluid intake: 64+ oz stanley (twice when at home and not at work, once on work/school days) Daily protein: 70 grams on average Supplements: Vit D drops Current average weekly physical activity: after planning (1st period) she walking around the campus, 3 times per week 1 mile minutes, walking before or after dinner in the neighbor hood. Chair squats/stand.  24-Hr Dietary Recall First Meal: 1/2 cup egg whites and 1/4 - 1/2 cup cottage cheese with spinach baked, strawberries, cheese stick (weekend) or shake (Ripple Shake with protein powder) Snack: more strawberries and cheese stick Second Meal: left overs (skirt steak, asparagus); Sunday (egg whites with cottage cheese with feta cheese) weekdays: cheese, strawberries/blueberries, chomps beef  stick) Snack: pretzels Third Meal: broccoli, chicken (pan seared), onions Snack:  Beverages: water, mineral water, iced coffee with ripple milk or skinny mixes  Alcoholic beverages per week: 0 (none since 2020, due to medications)   Estimated Energy Needs Calories: 1500  NUTRITION DIAGNOSIS  Overweight/obesity ([place]-3.3) related to past poor dietary habits and physical inactivity as evidenced by patient w/ planned sleeve surgery following dietary guidelines for continued weight loss.  NUTRITION INTERVENTION  Nutrition counseling (C-1) and education (E-2) to facilitate bariatric surgery goals.  To re-engage in physical activity while healing from a knee or ankle injury, focus on adding light weight exercises for the upper body and arms helps preserve strength and muscle tone. This balanced approach keeps you active, supports circulation and recovery, and builds confidence without risking further injury. By gradually increasing activity and prioritizing safe, controlled movements, youll create a strong foundation for healing and long-term fitness.  Pre-Op Goals Reviewed with the Patient  Goals Continue: slow down at meal time; take 20-30 minutes to eat Continue: aim for satisfaction before fullness Continue: practice not drinking with meals Continue: start with smaller portions Continue: food and water rhythm to get fluid in and practice not drinking with meals, while eating at scheduled meal and snack times. Continue: track your protein; aim for 60 grams per day Re-engage: increase physical activity; aim for 4 days per week; for 20-30 minutes (5 minute increments for chair squats); use light weights with upper body/arms while healing from knee/ankle injury. 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  Next Steps  Patient is to return to [facility] in 1-2 weeks for next SWL visit.

## 2024-03-25 ENCOUNTER — Encounter: Payer: Self-pay | Admitting: Dietician

## 2024-03-25 ENCOUNTER — Encounter: Attending: General Surgery | Admitting: Dietician

## 2024-03-25 VITALS — Ht 65.0 in | Wt 387.5 lb

## 2024-03-25 DIAGNOSIS — E669 Obesity, unspecified: Secondary | ICD-10-CM | POA: Diagnosis present

## 2024-03-25 NOTE — Progress Notes (Signed)
 Supervised Weight Loss Visit Bariatric Nutrition Education Appt Start Time: 0920    End Time: 0941  Planned surgery: Sleeve Gastrectomy Pt expectation of surgery: to get under 200 lbs as a long term goal. Also gain more energy, move around more  Referral stated Supervised Weight Loss (SWL) visits needed: 12  9 out of 12 SWL Appointments  NUTRITION ASSESSMENT   Anthropometrics  Start weight at [facility]: 388.2 lbs (date: 10/19/2023)  Height: 65 in Weight today: 387.5 lbs (with boot for ankle... sprained a ligament) BMI: 64.48 kg/m2     Clinical  Medical hx: HTN, obesity Medications: levothyroxine , metformin, phentermine, losartan, hydrochlorothiazide  Supplements: Vit D drops Labs: Vit D 25.5, LDL 105; TSH 6.080 Notable signs/symptoms: none noted Any previous deficiencies? No  Lifestyle & Dietary Hx  Pt states her achilles is still really inflamed, stating she still needs to be in her boot. Pt states her foot does not hurt when in the boot. Pt states a couple of days she did not meet her protein goals, stating the other days she did. Pt states when she works, meal planning and prep is easier, but when on holiday break it has been harder to meal prep. Pt states she is not at risk for low iron.  Estimated daily fluid intake: 64+ oz stanley (twice when at home and not at work, once on work/school days) Daily protein:  grams on average Supplements: Vit D drops (4 drops) Current average weekly physical activity: after planning (1st period) she walking around the campus, 3 times per week 1 mile minutes, walking before or after dinner in the neighbor hood. Chair squats/stand.  24-Hr Dietary Recall First Meal: 1/2 cup egg whites and 1/4 - 1/2 cup cottage cheese with spinach baked, strawberries, cheese stick (weekend) or shake (Ripple Shake with protein powder) Snack: more strawberries and cheese stick Second Meal: left overs (skirt steak, asparagus); Sunday (egg whites with cottage cheese  with feta cheese) weekdays: cheese, strawberries/blueberries, chomps beef stick) Snack: pretzels Third Meal: broccoli, chicken (pan seared), onions Snack:  Beverages: water, mineral water, iced coffee with ripple milk or skinny mixes  Alcoholic beverages per week: 0 (none since 2020, due to medications)   Estimated Energy Needs Calories: 1500  NUTRITION DIAGNOSIS  Overweight/obesity ([place]-3.3) related to past poor dietary habits and physical inactivity as evidenced by patient w/ planned sleeve surgery following dietary guidelines for continued weight loss.  NUTRITION INTERVENTION  Nutrition counseling (C-1) and education (E-2) to facilitate bariatric surgery goals.  When youre preparing for bariatric surgery, the holiday break can feel especially challenging because your usual work/school routine isnt there to anchor your eating habits. Thats exactly why meal planning and prepping during this time becomes so important. It gives you structure when your days feel looser, helps you avoid grazing or skipping meals, and keeps your nutrition aligned with the habits youll need after surgery. Using this time to practice portion control, build balanced meals, and create a predictable eating rhythm sets a strong foundation. Then, when work/school starts back and life gets busy again, continuing those same routines makes it easier to stay consistent, reduce stress, and support your body as you move toward surgery and long-term success.  Pre-Op Goals Reviewed with the Patient  Goals Continue: slow down at meal time; take 20-30 minutes to eat Continue: aim for satisfaction before fullness Continue: practice not drinking with meals Continue: start with smaller portions Continue: food and water rhythm to get fluid in and practice not drinking with meals, while  eating at scheduled meal and snack times. Continue: track your protein; aim for 60 grams per day Continue: increase physical activity; aim for  4 days per week; for 20-30 minutes (5 minute increments for chair squats); use light weights with upper body/arms while healing from knee/ankle injury. 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  Next Steps  Patient is to return to [facility] in 1-2 weeks for next SWL visit.

## 2024-03-29 ENCOUNTER — Encounter: Admitting: Dietician

## 2024-04-09 ENCOUNTER — Encounter: Payer: Self-pay | Admitting: Dietician

## 2024-04-09 ENCOUNTER — Encounter: Admitting: Dietician

## 2024-04-09 VITALS — Ht 65.0 in | Wt 387.2 lb

## 2024-04-09 DIAGNOSIS — E669 Obesity, unspecified: Secondary | ICD-10-CM | POA: Insufficient documentation

## 2024-04-09 NOTE — Progress Notes (Signed)
 Supervised Weight Loss Visit Bariatric Nutrition Education Appt Start Time: 1655    End Time: 1719  Planned surgery: Sleeve Gastrectomy Pt expectation of surgery: to get under 200 lbs as a long term goal. Also gain more energy, move around more  Referral stated Supervised Weight Loss (SWL) visits needed: 12  10 out of 12 SWL Appointments  NUTRITION ASSESSMENT   Anthropometrics  Start weight at [facility]: 388.2 lbs (date: 10/19/2023)  Height: 65 in Weight today: 387.2 lbs (with boot for ankle... sprained a ligament) BMI: 64.43 kg/m2     Clinical  Medical hx: HTN, obesity Medications: levothyroxine , metformin, phentermine, losartan, hydrochlorothiazide , aripiprazole Supplements: Vit D drops Labs: Vit D 25.5, LDL 105; TSH 6.080 Notable signs/symptoms: none noted Any previous deficiencies? No  Lifestyle & Dietary Hx  Pt states her insurance does not cover [facility] and the surgeon's office. Pt is extremely frustrated. Pt states she has been prescribed a new medication of mental health, stating she is now on aripiprazole Pt states meal planning and prepping during the break and continuing after the break. Pt states she got a new mini fridge with a freezer for at school and storing meal prepping. Pt states she has been mindful, aiming to not skip meals, and not relying in protein shakes. Pt states she has been walking more, stating she is parking further away. Pt states she is taking the stairs, stating it is shorter for her to take the stairs.  Estimated daily fluid intake: 64+ (almost 80) oz stanley (twice when at home and not at work, once on work/school days) Daily protein:  50-65 grams on average Supplements: Vit D drops (4 drops) Current average weekly physical activity: after planning (1st period) she walking around the campus, 3 times per week 1 mile minutes, walking before or after dinner in the neighbor hood. Chair squats/stand.  24-Hr Dietary Recall First Meal: 1/2 cup egg  whites and 1/4 - 1/2 cup cottage cheese with spinach baked, strawberries, cheese stick (weekend) or shake (Ripple Shake with protein powder) Snack: more strawberries and cheese stick Second Meal: left overs (skirt steak, asparagus); Sunday (egg whites with cottage cheese with feta cheese) weekdays: cheese, strawberries/blueberries, chomps beef stick) Snack: pretzels Third Meal: broccoli, chicken (pan seared), onions Snack:  Beverages: water, mineral water, iced coffee with ripple milk or skinny mixes  Alcoholic beverages per week: 0 (none since 2020, due to medications)   Estimated Energy Needs Calories: 1500  NUTRITION DIAGNOSIS  Overweight/obesity ([place]-3.3) related to past poor dietary habits and physical inactivity as evidenced by patient w/ planned sleeve surgery following dietary guidelines for continued weight loss.  NUTRITION INTERVENTION  Nutrition counseling (C-1) and education (E-2) to facilitate bariatric surgery goals.  Sticking with your established goals before bariatric surgery is a smart, steady way to build confidence without adding unnecessary pressure. Youre already practicing the habits that matter most--tracking protein, improving hydration, planning meals, and adjusting portions--and giving yourself time to master them makes the transition after surgery much smoother. Keeping the focus on consistency rather than adding new goals prevents overwhelm and helps each habit feel more natural. This kind of stability is exactly what supports long-term success.  Exploring unflavored protein powder is a great way to break free from those overly sweet, dessert-like shakes that can get tiring fast. Unflavored powders blend smoothly into skim milk, unsweetened plant-based milks, or even plain water, giving you a lighter, more neutral option that still helps you meet your protein goals. This kind of experimenting helps  you find combinations that feel comfortable, gentle on your stomach,  and easy to tolerate after bariatric surgery.  Pre-Op Goals Reviewed with the Patient  Goals Continue: slow down at meal time; take 20-30 minutes to eat Continue: aim for satisfaction before fullness Continue: practice not drinking with meals/snacks Continue: start with smaller portions Continue: food and water rhythm to get fluid in and practice not drinking with meals, while eating at scheduled meal and snack times. Continue: track your protein; aim for 60 grams per day Continue: increase physical activity; aim for 4 days per week; for 20-30 minutes (5 minute increments for chair squats); use light weights with upper body/arms while healing from knee/ankle injury. 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 New: try unflavored protein powder in broth, skim milk, unsweetened plant based milk, etc...  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  Next Steps  Patient is to return to [facility] in 1-2 weeks for next SWL visit.

## 2024-04-15 ENCOUNTER — Encounter: Admitting: Dietician

## 2024-04-23 ENCOUNTER — Encounter: Admitting: Dietician

## 2024-05-03 ENCOUNTER — Encounter: Payer: Self-pay | Admitting: Dietician

## 2024-05-03 ENCOUNTER — Encounter: Admitting: Dietician

## 2024-05-03 VITALS — Ht 65.0 in | Wt 393.0 lb

## 2024-05-03 DIAGNOSIS — E669 Obesity, unspecified: Secondary | ICD-10-CM

## 2024-05-03 NOTE — Progress Notes (Signed)
 Supervised Weight Loss Visit Bariatric Nutrition Education Appt Start Time: 0835    End Time: 0858  Planned surgery: Sleeve Gastrectomy Pt expectation of surgery: to get under 200 lbs as a long term goal. Also gain more energy, move around more  Referral stated Supervised Weight Loss (SWL) visits needed: 12  11 out of 12 SWL Appointments  NUTRITION ASSESSMENT   Anthropometrics  Start weight at [facility]: 388.2 lbs (date: 10/19/2023)  Height: 65 in Weight today: 393.0 lbs (with boot for ankle... sprained a ligament) BMI: 65.40 kg/m2     Clinical  Medical hx: HTN, obesity Medications: levothyroxine , metformin, phentermine, losartan, hydrochlorothiazide , aripiprazole Supplements: Vit D drops Labs: Vit D 25.5, LDL 105; TSH 6.080 Notable signs/symptoms: none noted Any previous deficiencies? No  Lifestyle & Dietary Hx  Pt states she found a new provider for the surgery, stating it is in [place], [place] and is in network with her insurance. Pt states she stopped buying already made over night oats, stating she is making her own Pt states she fell on the ice, stating it has slowed down physical activity, stating she is doing more chair exercises. Pt states she starts her physical therapy for her knee and ankle (from the fall at school) next week, stating she will work on lower body as well. Pt states she would like to increase her physical activity. Pt states she got some protein added chicken broth from [facility], stating she got a starter pack with other tools and food. Pt states she is recognizing satisfaction before fullness.  Estimated daily fluid intake: 64+ (almost 80) oz stanley (twice when at home and not at work, once on work/school days) Daily protein:  70 grams Supplements: Vit D drops (4 drops) Current average weekly physical activity: after planning (1st period) she walking around the campus, 3 times per week 1 mile minutes, walking before or after dinner in the neighbor hood.  Chair squats/stand.  24-Hr Dietary Recall First Meal: 1/2 cup egg whites and 1/4 - 1/2 cup cottage cheese with spinach baked, strawberries, cheese stick (weekend) or shake (Ripple Shake with protein powder) Snack: more strawberries and cheese stick Second Meal: left overs (skirt steak, asparagus); Sunday (egg whites with cottage cheese with feta cheese) weekdays: cheese, strawberries/blueberries, chomps beef stick) Snack: pretzels Third Meal: broccoli, chicken (pan seared), onions Snack:  Beverages: water, mineral water, iced coffee with ripple milk or skinny mixes  Alcoholic beverages per week: 0 (none since 2020, due to medications)   Estimated Energy Needs Calories: 1500  NUTRITION DIAGNOSIS  Overweight/obesity ([place]-3.3) related to past poor dietary habits and physical inactivity as evidenced by patient w/ planned sleeve surgery following dietary guidelines for continued weight loss.  NUTRITION INTERVENTION  Nutrition counseling (C-1) and education (E-2) to facilitate bariatric surgery goals.  Continuing to increase your physical activity before bariatric surgery helps you build the routines and confidence youll rely on afterward, when movement becomes an important part of maintaining strength, supporting recovery, and protecting long-term weight loss. Establishing these habits now--whether through walking, light strength work, or simply moving more throughout the day--makes activity feel natural rather than forced, and it sets the stage for a smoother transition once your body is healing and your energy gradually returns.  Pre-Op Goals Reviewed with the Patient  Goals Continue: slow down at meal time; take 20-30 minutes to eat Continue: aim for satisfaction before fullness Continue: practice not drinking with meals/snacks Continue: start with smaller portions Continue: food and water rhythm to get fluid  in and practice not drinking with meals, while eating at scheduled meal and  snack times. Continue: track your protein; aim for 60 grams per day Re-engage: increase physical activity; aim for 4 days per week; for 20-30 minutes (5 minute increments for chair squats); use light weights with upper body/arms while healing from knee/ankle injury. 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 Re-engage: try unflavored protein powder in broth, skim milk, unsweetened plant based milk, etc...  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  Next Steps  Patient is to return to [facility] in 1-2 weeks for next SWL visit.

## 2024-05-07 ENCOUNTER — Encounter: Admitting: Dietician
